# Patient Record
Sex: Female | Born: 1990 | Race: White | Hispanic: No | Marital: Single | Smoking: Current every day smoker
Health system: Southern US, Community
[De-identification: ages and names within clinical notes are randomized; demographics above are authoritative.]

## PROBLEM LIST (undated history)

## (undated) DIAGNOSIS — F319 Bipolar disorder, unspecified: Secondary | ICD-10-CM

## (undated) DIAGNOSIS — F419 Anxiety disorder, unspecified: Secondary | ICD-10-CM

## (undated) DIAGNOSIS — F209 Schizophrenia, unspecified: Secondary | ICD-10-CM

## (undated) DIAGNOSIS — R569 Unspecified convulsions: Secondary | ICD-10-CM

## (undated) HISTORY — DX: Unspecified convulsions: R56.9

## (undated) HISTORY — DX: Anxiety disorder, unspecified: F41.9

## (undated) HISTORY — DX: Schizophrenia, unspecified: F20.9

---
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
## 2012-07-17 ENCOUNTER — Inpatient Hospital Stay (HOSPITAL_COMMUNITY)
Admission: AD | Admit: 2012-07-17 | Discharge: 2012-07-17 | Disposition: A | Discharge status: Home / Self Care | Payer: BC Managed Care – PPO | Source: Ambulatory Visit | Attending: Obstetrics & Gynecology | Admitting: Obstetrics & Gynecology

## 2012-07-17 DIAGNOSIS — N926 Irregular menstruation, unspecified: Secondary | ICD-10-CM

## 2012-07-17 DIAGNOSIS — N938 Other specified abnormal uterine and vaginal bleeding: Secondary | ICD-10-CM | POA: Insufficient documentation

## 2012-07-17 DIAGNOSIS — N949 Unspecified condition associated with female genital organs and menstrual cycle: Secondary | ICD-10-CM | POA: Insufficient documentation

## 2012-07-17 LAB — URINALYSIS, ROUTINE W REFLEX MICROSCOPIC
Leukocytes, UA: NEGATIVE
Nitrite: NEGATIVE
Protein, ur: NEGATIVE mg/dL
Specific Gravity, Urine: 1.01 (ref 1.005–1.030)
Urobilinogen, UA: 0.2 mg/dL (ref 0.0–1.0)

## 2012-07-17 LAB — CBC
MCHC: 33.8 g/dL (ref 30.0–36.0)
Platelets: 190 10*3/uL (ref 150–400)
RDW: 13 % (ref 11.5–15.5)
WBC: 9.4 10*3/uL (ref 4.0–10.5)

## 2012-07-17 LAB — POCT PREGNANCY, URINE: Preg Test, Ur: NEGATIVE

## 2012-07-17 LAB — URINE MICROSCOPIC-ADD ON

## 2012-07-17 LAB — WET PREP, GENITAL

## 2012-07-17 MED ORDER — MEDROXYPROGESTERONE ACETATE 150 MG/ML IM SUSP
150.0000 mg | Freq: Once | INTRAMUSCULAR | Status: AC
  Administered 2012-07-17: 150 mg via INTRAMUSCULAR
  Filled 2012-07-17: qty 1

## 2012-07-17 NOTE — MAU Note (Signed)
Patient is in with c/o irregular heavy vaginal bleeding. She states that she is due for depo provera injection tomorrow. Usually gets it at [facility] physicians. She states that she have been on depo for years never had a problem with bleeding until April 26th. She states that the blood is dark, changing super tampons every 2 hours. She denies pain at this time.

## 2012-07-17 NOTE — MAU Provider Note (Signed)
Attestation of Attending Supervision of Advanced Practitioner (CNM/NP): Evaluation and management procedures were performed by the Advanced Practitioner under my supervision and collaboration.  I have reviewed the Advanced Practitioner's note and chart, and I agree with the management and plan.  HARRAWAY-SMITH, Reena Borromeo 3:04 PM

## 2012-07-17 NOTE — MAU Provider Note (Signed)
History     CSN: [id number]  Arrival date and time: 07/17/12 1308   First Provider Initiated Contact with Patient 07/17/12 1356      Chief Complaint  Patient presents with  . Vaginal Bleeding   HPI Ms. Catherine Shannon is a 22 y.o. female who presents to MAU today for irregular bleeding. The patient states that she has been on Depo provera for almost 5 years. She has not had any issues with irregular bleeding before. She is due for her next injection tomorrow. She started bleeding on 06/09/12 and has had some bleeding most days since then. She states her bleeding has been heavier the last 2-3 days. She changes her tampon every 2-3 hours, but they are not saturated at that time. She went to PCP who gives her Depo and they couldn't "figure out why she was bleeding." They told her to make an appointment with GYN. She has an appointment with Dr. Arelia Sneddon in a few weeks, but came in today because she was concerned about increased bleeding. She denies pain, discharge or fever. She is not currently sexually active because of the bleeding.   OB History   Grav Para Term Preterm Abortions TAB SAB Ect Mult Living                  No past medical history on file.  No past surgical history on file.  No family history on file.  History  Substance Use Topics  . Smoking status: Not on file  . Smokeless tobacco: Not on file  . Alcohol Use: Not on file    Allergies: No Known Allergies  Prescriptions prior to admission  Medication Sig Dispense Refill  . acetaminophen (TYLENOL) 500 MG tablet Take 1,000 mg by mouth every 6 (six) hours as needed for pain.      Marland Kitchen ALPRAZolam (XANAX) 1 MG tablet Take 1 mg by mouth 2 (two) times daily.      Marland Kitchen Fexofenadine HCl (ALLEGRA PO) Take 1 tablet by mouth daily.      Marland Kitchen lamoTRIgine (LAMICTAL) 100 MG tablet Take 100 mg by mouth at bedtime.        Review of Systems  Constitutional: Negative for fever and malaise/fatigue.  Gastrointestinal: Negative for nausea,  vomiting and abdominal pain.  Genitourinary:       + vaginal bleeding Neg - vaginal discharge  Neurological: Negative for dizziness, loss of consciousness and weakness.   Physical Exam   Blood pressure 99/59, pulse 122, temperature 98.6 F (37 C), temperature source Oral, resp. rate 18, height 5\' 6"  (1.676 m), weight 120 lb 2 oz (54.488 kg), SpO2 100.00%.  Physical Exam  Constitutional: She is oriented to person, place, and time. She appears well-developed and well-nourished. No distress.  HENT:  Head: Normocephalic and atraumatic.  Cardiovascular: Normal rate, regular rhythm and normal heart sounds.   Respiratory: Effort normal and breath sounds normal. No respiratory distress.  GI: Soft. Bowel sounds are normal. She exhibits no distension and no mass. There is no tenderness. There is no rebound and no guarding.  Genitourinary: Uterus is not enlarged and not tender. Cervix exhibits no motion tenderness, no discharge and no friability. Right adnexum displays no mass and no tenderness. Left adnexum displays no mass and no tenderness. There is bleeding (scant dark Perryman bleeding noted) around the vagina. No vaginal discharge found.  Neurological: She is alert and oriented to person, place, and time.  Skin: Skin is warm and dry. No erythema.  Psychiatric:  She has a normal mood and affect.   Results for orders placed during the hospital encounter of 07/17/12 (from the past 24 hour(s))  URINALYSIS, ROUTINE W REFLEX MICROSCOPIC     Status: Abnormal   Collection Time    07/17/12  1:15 PM      Result Value Range   Color, Urine YELLOW  YELLOW   APPearance CLEAR  CLEAR   Specific Gravity, Urine 1.010  1.005 - 1.030   pH 7.0  5.0 - 8.0   Glucose, UA NEGATIVE  NEGATIVE mg/dL   Hgb urine dipstick SMALL (*) NEGATIVE   Bilirubin Urine NEGATIVE  NEGATIVE   Ketones, ur NEGATIVE  NEGATIVE mg/dL   Protein, ur NEGATIVE  NEGATIVE mg/dL   Urobilinogen, UA 0.2  0.0 - 1.0 mg/dL   Nitrite NEGATIVE   NEGATIVE   Leukocytes, UA NEGATIVE  NEGATIVE  URINE MICROSCOPIC-ADD ON     Status: None   Collection Time    07/17/12  1:15 PM      Result Value Range   Squamous Epithelial / LPF RARE  RARE   WBC, UA 0-2  <3 WBC/hpf   RBC / HPF 0-2  <3 RBC/hpf   Bacteria, UA RARE  RARE  CBC     Status: None   Collection Time    07/17/12  1:33 PM      Result Value Range   WBC 9.4  4.0 - 10.5 K/uL   RBC 4.46  3.87 - 5.11 MIL/uL   Hemoglobin 13.6  12.0 - 15.0 g/dL   HCT 16.1  09.6 - 04.5 %   MCV 90.1  78.0 - 100.0 fL   MCH 30.5  26.0 - 34.0 pg   MCHC 33.8  30.0 - 36.0 g/dL   RDW 40.9  81.1 - 91.4 %   Platelets 190  150 - 400 K/uL  POCT PREGNANCY, URINE     Status: None   Collection Time    07/17/12  1:52 PM      Result Value Range   Preg Test, Ur NEGATIVE  NEGATIVE  WET PREP, GENITAL     Status: Abnormal   Collection Time    07/17/12  2:12 PM      Result Value Range   Yeast Wet Prep HPF POC NONE SEEN  NONE SEEN   Trich, Wet Prep NONE SEEN  NONE SEEN   Clue Cells Wet Prep HPF POC NONE SEEN  NONE SEEN   WBC, Wet Prep HPF POC FEW (*) NONE SEEN    MAU Course  Procedures None  MDM Hemodynamically stable.  Patient given Depo provera in MAU today. UPT negative.  UA and wet prep negative Assessment and Plan  A: Irregular bleeding on Depo provera  P: Discharge home Patient encouraged to keep follow-up with Dr. Arelia Sneddon as scheduled to establish care with a GYN and discuss other birth control options at that time Patient may return to MAU as needed  Freddi Starr, PA-C  07/17/2012, 2:58 PM

## 2012-10-11 ENCOUNTER — Ambulatory Visit: Payer: BC Managed Care – PPO | Admitting: Family Medicine

## 2012-10-11 VITALS — BP 112/76 | HR 70 | Temp 99.1°F | Resp 16 | Ht 66.5 in | Wt 119.0 lb

## 2012-10-11 DIAGNOSIS — N939 Abnormal uterine and vaginal bleeding, unspecified: Secondary | ICD-10-CM

## 2012-10-11 DIAGNOSIS — N760 Acute vaginitis: Secondary | ICD-10-CM

## 2012-10-11 DIAGNOSIS — F319 Bipolar disorder, unspecified: Secondary | ICD-10-CM

## 2012-10-11 DIAGNOSIS — N898 Other specified noninflammatory disorders of vagina: Secondary | ICD-10-CM

## 2012-10-11 DIAGNOSIS — Z3009 Encounter for other general counseling and advice on contraception: Secondary | ICD-10-CM

## 2012-10-11 DIAGNOSIS — B9689 Other specified bacterial agents as the cause of diseases classified elsewhere: Secondary | ICD-10-CM

## 2012-10-11 LAB — POCT URINALYSIS DIPSTICK
Protein, UA: NEGATIVE
Spec Grav, UA: >=1.03
Urobilinogen, UA: 0.2
pH, UA: 5.5

## 2012-10-11 LAB — POCT UA - MICROSCOPIC ONLY
Bacteria, U Microscopic: NEGATIVE
Casts, Ur, LPF, POC: NEGATIVE
Crystals, Ur, HPF, POC: NEGATIVE
Epithelial cells, urine per micros: NEGATIVE
Mucus, UA: NEGATIVE

## 2012-10-11 LAB — POCT WET PREP WITH KOH: KOH Prep POC: NEGATIVE

## 2012-10-11 MED ORDER — LAMOTRIGINE 25 & 50 & 100 MG PO KIT: PACK | ORAL | Status: DC

## 2012-10-11 MED ORDER — NORGESTIM-ETH ESTRAD TRIPHASIC 0.18/0.215/0.25 MG-25 MCG PO TABS: 1.0000 | ORAL_TABLET | Freq: Every day | ORAL | Status: DC

## 2012-10-11 MED ORDER — ALPRAZOLAM 1 MG PO TABS: 1.0000 mg | ORAL_TABLET | Freq: Two times a day (BID) | ORAL | Status: DC | PRN

## 2012-10-11 MED ORDER — METRONIDAZOLE 500 MG PO TABS: 500.0000 mg | ORAL_TABLET | Freq: Two times a day (BID) | ORAL | Status: DC

## 2012-10-11 NOTE — Progress Notes (Signed)
Subjective:    Patient ID: Catherine Shannon, female    DOB: Sep 24, 1990, 22 y.o.   MRN: [id number] Chief Complaint  Patient presents with  . Vaginal Bleeding    Bleeding sice april on depo  . Medication Refill    zanax, lamictal    HPI  Catherine Shannon is here today with her grandomother Catherine Shannon.  On Depo for about 5-6 years with no bleeding at all and then for the past 4 months, she has had irregular period.  She was seen at [facility] and given her an injection of DepoProvera early which worked for a couple of weeks.  Last Depo shot was in June when she was at the [facility] ER.  Normally she is seen at [facility] with Elvera Lennox and Laurann Montana.  Is not going to back to [place] so wants to transfer care here.  Was last sexually active last night. Does use a condom every single time - in a steady relationship x 5 months.  Wants to do the pill a little. But wants to start on an IUD so will refer to gyn. Has bad anxiety and extremely bipolar - has known it forever.  Has been on lamotrigine for a yr and xanax since Dec.  History reviewed. No pertinent past medical history. Current Outpatient Prescriptions on File Prior to Visit  Medication Sig Dispense Refill  . Fexofenadine HCl (ALLEGRA PO) Take 1 tablet by mouth daily.      Marland Kitchen acetaminophen (TYLENOL) 500 MG tablet Take 1,000 mg by mouth every 6 (six) hours as needed for pain.      Marland Kitchen ALPRAZolam (XANAX) 1 MG tablet Take 1 mg by mouth 2 (two) times daily.      Marland Kitchen lamoTRIgine (LAMICTAL) 100 MG tablet Take 100 mg by mouth at bedtime.       No current facility-administered medications on file prior to visit.   No Known Allergies   Review of Systems  Constitutional: Negative for fever, chills, activity change and appetite change.  Genitourinary: Positive for vaginal bleeding, vaginal discharge and pelvic pain. Negative for dysuria, difficulty urinating and dyspareunia.  Skin: Negative for rash.  Psychiatric/Behavioral: Positive for behavioral problems,  sleep disturbance and dysphoric mood. Negative for suicidal ideas. The patient is nervous/anxious.       BP 112/76  Pulse 70  Temp(Src) 99.1 F (37.3 C) (Oral)  Resp 16  Ht 5' 6.5" (1.689 m)  Wt 119 lb (53.978 kg)  BMI 18.92 kg/m2  SpO2 98% Objective:   Physical Exam  Constitutional: She is oriented to person, place, and time. She appears well-developed and well-nourished. No distress.  HENT:  Head: Normocephalic and atraumatic.  Cardiovascular: Normal rate, regular rhythm, normal heart sounds and intact distal pulses.   Pulmonary/Chest: Effort normal and breath sounds normal.  Abdominal: Soft. Bowel sounds are normal. She exhibits no distension. There is no tenderness. There is no rebound and no guarding.  Genitourinary: Uterus normal. Pelvic exam was performed with patient supine. There is no rash, tenderness or lesion on the right labia. There is no rash, tenderness or lesion on the left labia. Cervix exhibits no motion tenderness and no friability. Right adnexum displays no mass, no tenderness and no fullness. Left adnexum displays no mass, no tenderness and no fullness. No erythema or tenderness around the vagina. Vaginal discharge found.  Lymphadenopathy:       Right: No inguinal adenopathy present.       Left: No inguinal adenopathy present.  Neurological: She is  alert and oriented to person, place, and time.  Skin: Skin is warm and dry. She is not diaphoretic.  Psychiatric: She has a normal mood and affect. Her behavior is normal.           Results for orders placed in visit on 10/11/12  POCT URINE PREGNANCY      Result Value Range   Preg Test, Ur Negative    POCT UA - MICROSCOPIC ONLY      Result Value Range   WBC, Ur, HPF, POC neg     RBC, urine, microscopic 0-3     Bacteria, U Microscopic neg     Mucus, UA neg     Epithelial cells, urine per micros neg     Crystals, Ur, HPF, POC neg     Casts, Ur, LPF, POC neg     Yeast, UA neg    POCT URINALYSIS DIPSTICK       Result Value Range   Color, UA yellow     Clarity, UA clear     Glucose, UA neg     Bilirubin, UA neg     Ketones, UA neg     Spec Grav, UA >=1.030     Blood, UA Large     pH, UA 5.5     Protein, UA neg     Urobilinogen, UA 0.2     Nitrite, UA neg     Leukocytes, UA Negative    POCT WET PREP WITH KOH      Result Value Range   Trichomonas, UA Negative     Clue Cells Wet Prep HPF POC positive     Epithelial Wet Prep HPF POC 10-15     Yeast Wet Prep HPF POC neg     Bacteria Wet Prep HPF POC large     RBC Wet Prep HPF POC 20+     WBC Wet Prep HPF POC 0-3     KOH Prep POC Negative      Assessment & Plan:  Vaginal bleeding - Plan: POCT urine pregnancy, POCT UA - Microscopic Only, POCT urinalysis dipstick, POCT Wet Prep with KOH, CANCELED: POCT CBC, CANCELED: TSH  Bipolar disorder, unspecified - restart xanax and lamictal. [place] [facility] reviewed and appropriate. Addiction potential of xanax briefly reviewed. Rec establishing w/ a therapist.  Family planning - Plan: Ambulatory referral to Gynecology, CANCELED: Ambulatory referral to Gynecology - consider IUD placement, start OCPs in the meantime since bleeding through Depo.  Bacterial vaginosis  Meds ordered this encounter  Medications  . ALPRAZolam (XANAX) 1 MG tablet    Sig: Take 1 tablet (1 mg total) by mouth 2 (two) times daily as needed for anxiety.    Dispense:  60 tablet    Refill:  0  . Norgestimate-Ethinyl Estradiol Triphasic 0.18/0.215/0.25 MG-25 MCG tab    Sig: Take 1 tablet by mouth daily.    Dispense:  1 Package    Refill:  11  . LamoTRIgine 25 & 50 & 100 MG KIT    Sig: Take 25 mg po qd x 2 wks, then 50 mg po qd x 2 wks, then 100 mg po qd    Dispense:  1 kit    Refill:  0  . metroNIDAZOLE (FLAGYL) 500 MG tablet    Sig: Take 1 tablet (500 mg total) by mouth 2 (two) times daily.    Dispense:  14 tablet    Refill:  0

## 2012-10-11 NOTE — Patient Instructions (Addendum)
[facility]  [address], [place], [place] [address]  [phone]  [facility]  Address: [address], [place], [place] [address]  [phone]  [facility] - Dr. Allena Katz Address: [address] [place], [place] [address]  [phone]  [facility] [address] [place], [place] Phone: [phone]  Make an appointment for our 104 clinic next door to establish care so we can renew your medications and review your records.  Contraception Choices Birth control (contraception) can stop pregnancy from happening. Different types of birth control work in different ways. Some can:  Make the mucus in the cervix thick. This makes it hard for sperm to get into the uterus.  Thin the lining of the uterus. This makes it hard for an egg to attach to the wall of the uterus.  Stop the ovaries from releasing an egg.  Block the sperm from reaching the egg. Certain types of surgery can stop pregnancy from happening. For women, the sugery closes the fallopian tubes (tubal ligation). For men, the surgery stops sperm from releasing during sex (vasectomy). HORMONAL BIRTH CONTROL Hormonal birth control stops pregnancy by putting hormones into your body. Types of birth control include:  A small tube put under the skin of the upper arm (implant). The tube can stay in place for 3 years.  Shots given every 3 months.  Pills taken every day or once after sex (intercourse).  Patches that are changed once a week.  A ring put into the vagina (vaginal ring). The ring is left in place for 3 weeks and removed for 1 week. Then, a new ring is put in the vagina. BARRIER BIRTH CONTROL  Barrier birth control blocks sperm from reaching the egg. Types of birth control include:   A thin covering worn on the penis (female condom) during sex.  A soft, loose covering put into the vagina  (female condom) before sex.  A rubber bowl that sits over the cervix (diaphragm). The bowl must be made for you. The bowl is put into the vagina before sex. The bowl is left in place for 6 to 8 hours after sex.  A small, soft cup that fits over the cervix (cervical cap). The cup must be made for you. The cup can be left in place for 48 hours after sex.  A sponge that is put into the vagina before sex.  A chemical that kills or blocks sperm from getting into the cervix and uterus (spermicide). The chemical may be a cream, jelly, foam, or pill. INTRAUTERINE (IUD) BIRTH CONTROL  IUD birth control is a small, T-shaped piece of plastic. The plastic is put inside the uterus. There are 2 types of IUD:  Copper IUD. The IUD is covered in copper wire. The copper makes a fluid that kills sperm. It can stay in place for 10 years.  Hormone IUD. The hormone stops pregnancy from happening. It can stay in place for 5 years. NATURAL FAMILY PLANNING BIRTH CONTROL  Natural family planning means not having sex or using barrier birth control when the woman is fertile. A woman can:  Use a calendar to keep track of when she is fertile.  Use a thermometer to measure her body temperature. Protect yourself against sexual diseases no matter what type of birth control you use. Talk to your doctor about which type of birth control is best for you. Document Released: 11/28/2008 Document Revised: 04/25/2011 Document Reviewed: 06/09/2010  [employer] Patient Information 2014 [place], [place]. Bacterial Vaginosis Bacterial vaginosis (BV) is a vaginal infection where the normal balance of bacteria in the vagina is disrupted. The normal balance is then replaced by an overgrowth of certain bacteria. There are several different kinds of bacteria that can cause BV. BV is the most common vaginal infection in women of childbearing age. CAUSES   The cause of BV is not fully understood. BV develops when there is an increase or  imbalance of harmful bacteria.  Some activities or behaviors can upset the normal balance of bacteria in the vagina and put women at increased risk including:  Having a new sex partner or multiple sex partners.  Douching.  Using an intrauterine device (IUD) for contraception.  It is not clear what role sexual activity plays in the development of BV. However, women that have never had sexual intercourse are rarely infected with BV. Women do not get BV from toilet seats, bedding, swimming pools or from touching objects around them.  SYMPTOMS   Grey vaginal discharge.  A fish-like odor with discharge, especially after sexual intercourse.  Itching or burning of the vagina and vulva.  Burning or pain with urination.  Some women have no signs or symptoms at all. DIAGNOSIS  Your caregiver must examine the vagina for signs of BV. Your caregiver will perform lab tests and look at the sample of vaginal fluid through a microscope. They will look for bacteria and abnormal cells (clue cells), a pH test higher than 4.5, and a positive amine test all associated with BV.  RISKS AND COMPLICATIONS   Pelvic inflammatory disease (PID).  Infections following gynecology surgery.  Developing HIV.  Developing herpes virus. TREATMENT  Sometimes BV will clear up without treatment. However, all women with symptoms of BV should be treated to avoid complications, especially if gynecology surgery is planned. Female partners generally do not need to be treated. However, BV may spread between female sex partners so treatment is helpful in preventing a recurrence of BV.   BV may be treated with antibiotics. The antibiotics come in either pill or vaginal cream forms. Either can be used with nonpregnant or pregnant women, but the recommended dosages differ. These antibiotics are not harmful to the baby.  BV can recur after treatment. If this happens, a second round of antibiotics will often be  prescribed.  Treatment is important for pregnant women. If not treated, BV can cause a premature delivery, especially for a pregnant woman who had a premature birth in the past. All pregnant women who have symptoms of BV should be checked and treated.  For chronic reoccurrence of BV, treatment with a type of prescribed gel vaginally twice a week is helpful. HOME CARE INSTRUCTIONS   Finish all medication as directed by your caregiver.  Do not have sex until treatment is completed.  Tell your sexual partner that you have a vaginal infection. They should see their caregiver and be treated if they have problems, such as a mild rash or itching.  Practice safe sex. Use condoms. Only have 1 sex partner. PREVENTION  Basic prevention steps can help reduce the risk of upsetting the natural balance of bacteria in the vagina and developing BV:  Do not have sexual intercourse (be abstinent).  Do not douche.  Use all of the medicine prescribed for treatment of BV, even if the signs and symptoms go away.  Tell your sex partner if you have BV. That way, they can be treated, if needed, to  prevent reoccurrence. SEEK MEDICAL CARE IF:   Your symptoms are not improving after 3 days of treatment.  You have increased discharge, pain, or fever. MAKE SURE YOU:   Understand these instructions.  Will watch your condition.  Will get help right away if you are not doing well or get worse. FOR MORE INFORMATION  Division of STD Prevention (DSTDP), Centers for Disease Control and Prevention: SolutionApps.co.za American Social Health Association (ASHA): www.ashastd.org  Document Released: 01/31/2005 Document Revised: 04/25/2011 Document Reviewed: 07/24/2008 [facility] Patient Information 2014 [place], [place].

## 2012-10-16 ENCOUNTER — Telehealth: Payer: Self-pay

## 2012-10-16 NOTE — Telephone Encounter (Signed)
Tried to call patient, phone number has been disconnected.  Per Catherine Shannon, patient states her BCP are to expensive.  Patient needs to call her insurance and see what they cover per Rhoderick Moody, PA-C

## 2012-10-24 ENCOUNTER — Telehealth: Payer: Self-pay

## 2012-10-24 NOTE — Telephone Encounter (Signed)
Patient has given [place] a new phone number to call, she is saying that her birth control is too expensive please call her at [phone]

## 2012-10-24 NOTE — Telephone Encounter (Signed)
Left message for her to advise Rx has been sent previously

## 2012-10-24 NOTE — Telephone Encounter (Signed)
She states previously was $13, now it is $115. She is advised to call me back and let me know what is preferred drug with her plan.

## 2012-10-24 NOTE — Telephone Encounter (Signed)
Catherine Shannon was prescribed a month worth of lamictal (in a started kit so she can wean up) and plenty of birth control at her visit on 8/28. She has a f/u visit on 926 scheduled so we can continue her lamictal if it is working for her.  Did the pharmacy not receive the rxs?

## 2012-10-24 NOTE — Telephone Encounter (Signed)
Pt needs refill on Lamictal and BCP. Her insurance took care of her BCP being too high. She just needs it called in now.

## 2012-11-09 ENCOUNTER — Encounter: Payer: BC Managed Care – PPO | Admitting: Family Medicine

## 2012-11-16 ENCOUNTER — Ambulatory Visit (INDEPENDENT_AMBULATORY_CARE_PROVIDER_SITE_OTHER): Payer: BC Managed Care – PPO | Admitting: Women's Health

## 2012-11-16 ENCOUNTER — Encounter: Payer: Self-pay | Admitting: Family Medicine

## 2012-11-16 ENCOUNTER — Ambulatory Visit: Payer: BC Managed Care – PPO | Admitting: Family Medicine

## 2012-11-16 ENCOUNTER — Encounter: Payer: Self-pay | Admitting: Women's Health

## 2012-11-16 VITALS — BP 114/72 | HR 91 | Temp 99.6°F | Resp 16 | Ht 66.5 in | Wt 119.8 lb

## 2012-11-16 VITALS — BP 100/58 | Ht 67.2 in | Wt 120.6 lb

## 2012-11-16 DIAGNOSIS — N949 Unspecified condition associated with female genital organs and menstrual cycle: Secondary | ICD-10-CM

## 2012-11-16 DIAGNOSIS — Z309 Encounter for contraceptive management, unspecified: Secondary | ICD-10-CM

## 2012-11-16 DIAGNOSIS — Z01419 Encounter for gynecological examination (general) (routine) without abnormal findings: Secondary | ICD-10-CM

## 2012-11-16 DIAGNOSIS — B373 Candidiasis of vulva and vagina: Secondary | ICD-10-CM

## 2012-11-16 DIAGNOSIS — N938 Other specified abnormal uterine and vaginal bleeding: Secondary | ICD-10-CM

## 2012-11-16 DIAGNOSIS — IMO0001 Reserved for inherently not codable concepts without codable children: Secondary | ICD-10-CM

## 2012-11-16 DIAGNOSIS — Z23 Encounter for immunization: Secondary | ICD-10-CM

## 2012-11-16 DIAGNOSIS — F39 Unspecified mood [affective] disorder: Secondary | ICD-10-CM

## 2012-11-16 DIAGNOSIS — F319 Bipolar disorder, unspecified: Secondary | ICD-10-CM

## 2012-11-16 LAB — CBC WITH DIFFERENTIAL/PLATELET
Basophils Absolute: 0.1 10*3/uL (ref 0.0–0.1)
Eosinophils Relative: 1 % (ref 0–5)
HCT: 38.4 % (ref 36.0–46.0)
Hemoglobin: 13.4 g/dL (ref 12.0–15.0)
Lymphocytes Relative: 25 % (ref 12–46)
MCHC: 34.9 g/dL (ref 30.0–36.0)
MCV: 89.9 fL (ref 78.0–100.0)
Monocytes Absolute: 0.7 10*3/uL (ref 0.1–1.0)
Monocytes Relative: 9 % (ref 3–12)
RDW: 12.7 % (ref 11.5–15.5)

## 2012-11-16 LAB — WET PREP FOR TRICH, YEAST, CLUE

## 2012-11-16 MED ORDER — FLUCONAZOLE 150 MG PO TABS: 150.0000 mg | ORAL_TABLET | Freq: Once | ORAL | Status: DC

## 2012-11-16 MED ORDER — ALPRAZOLAM 1 MG PO TABS: 1.0000 mg | ORAL_TABLET | Freq: Two times a day (BID) | ORAL | Status: DC | PRN

## 2012-11-16 MED ORDER — NORGESTIMATE-ETH ESTRADIOL 0.25-35 MG-MCG PO TABS: 1.0000 | ORAL_TABLET | Freq: Every day | ORAL | Status: DC

## 2012-11-16 MED ORDER — DIVALPROEX SODIUM ER 500 MG PO TB24: 1000.0000 mg | ORAL_TABLET | Freq: Every day | ORAL | Status: DC

## 2012-11-16 NOTE — Patient Instructions (Addendum)
Dr Lily Peer will place with cycle Intrauterine Device Information An intrauterine device (IUD) is inserted into your uterus and prevents pregnancy. There are 2 types of IUDs available:  Copper IUD. This type of IUD is wrapped in copper wire and is placed inside the uterus. Copper makes the uterus and fallopian tubes produce a fluid that kills sperm. The copper IUD can stay in place for 10 years.  Hormone IUD. This type of IUD contains the hormone progestin (synthetic progesterone). The hormone thickens the cervical mucus and prevents sperm from entering the uterus, and it also thins the uterine lining to prevent implantation of a fertilized egg. The hormone can weaken or kill the sperm that get into the uterus. The hormone IUD can stay in place for 5 years. Your caregiver will make sure you are a good candidate for a contraceptive IUD. Discuss with your caregiver the possible side effects. ADVANTAGES  It is highly effective, reversible, long-acting, and low maintenance.  There are no estrogen-related side effects.  An IUD can be used when breastfeeding.  It is not associated with weight gain.  It works immediately after insertion.  The copper IUD does not interfere with your female hormones.  The progesterone IUD can make heavy menstrual periods lighter.  The progesterone IUD can be used for 5 years.  The copper IUD can be used for 10 years. DISADVANTAGES  The progesterone IUD can be associated with irregular bleeding patterns.  The copper IUD can make your menstrual flow heavier and more painful.  You may experience cramping and vaginal bleeding after insertion. Document Released: 01/05/2004 Document Revised: 04/25/2011 Document Reviewed: 06/05/2010 [facility] Patient Information 2014 [place], [place].

## 2012-11-16 NOTE — Progress Notes (Signed)
Subjective:    Patient ID: Catherine Shannon, female    DOB: 1990/05/05, 22 y.o.   MRN: [id number] Chief Complaint  Patient presents with  . Medication Refill    needs to discuss lamotrigine    HPI  Pt has had a rough month since we last met.  Her mother, with whom she has not had a particularly good relationship was just diagnosed w/ a terminal illness (poss brain cancer?).  She was planning on visiting pt over Nayra's birthday later this mo but now will not be able to so pt is considering going there. She also found out her boyfriend had been cheating on her - she was very dependent and devoted to him so this has been a shock.  Her grandmother is in and out of her life - current out as she is at home. Pt is living with her father who is very supportive. Her favorite activity is riding on his motorcycle with him - might get to go [place] to the mountains this wkend to see her uncle who is not doing well.  Never started the lamictal - it was $300 - called insurance co but got frustrated to never got anything figured out. Using prn xanax - has to take it every night to sleep and has to take it before work as well. Works at [employer]. Definitely wants to be on some sort of mood stabilizer - feels like her bipolar is out of control - just to much irritability - can't control her emotions - which is most troubling while at work.  Has never been on any other bipolar med other than lamictal which did work very well for her but open to whatever will help.  Has not been able to contact or establish with a therapist.  Has an appt with gyn this afternoon to discuss birth control - needs something long-term like IUD or nexplanon to use many of the medications she needs safely.  Past Medical History  Diagnosis Date  . Anxiety   . Depression   . Seizure   . Schizophrenia   . Bipolar 1 disorder    Current Outpatient Prescriptions on File Prior to Visit  Medication Sig Dispense Refill  . acetaminophen  (TYLENOL) 500 MG tablet Take 1,000 mg by mouth every 6 (six) hours as needed for pain.      Marland Kitchen Fexofenadine HCl (ALLEGRA PO) Take 1 tablet by mouth daily.      . metroNIDAZOLE (FLAGYL) 500 MG tablet Take 1 tablet (500 mg total) by mouth 2 (two) times daily.  14 tablet  0   No current facility-administered medications on file prior to visit.   No Known Allergies   Review of Systems  Constitutional: Positive for fatigue. Negative for fever, chills, diaphoresis, activity change, appetite change and unexpected weight change.  Cardiovascular: Negative for chest pain.  Gastrointestinal: Negative for nausea and vomiting.  Psychiatric/Behavioral: Positive for behavioral problems, sleep disturbance, dysphoric mood, decreased concentration and agitation. Negative for hallucinations and confusion. The patient is nervous/anxious. The patient is not hyperactive.       BP 114/72  Pulse 91  Temp(Src) 99.6 F (37.6 C) (Oral)  Resp 16  Ht 5' 6.5" (1.689 m)  Wt 119 lb 12.8 oz (54.341 kg)  BMI 19.05 kg/m2  SpO2 98%  LMP 11/12/2012 Objective:   Physical Exam  Constitutional: She is oriented to person, place, and time. She appears well-developed and well-nourished. No distress.  HENT:  Head: Normocephalic and atraumatic.  Right Ear: External ear normal.  Left Ear: External ear normal.  Eyes: Conjunctivae are normal. No scleral icterus.  Neck: Normal range of motion. Neck supple. No thyromegaly present.  Cardiovascular: Normal rate, regular rhythm, normal heart sounds and intact distal pulses.   Pulmonary/Chest: Effort normal and breath sounds normal. No respiratory distress.  Musculoskeletal: She exhibits no edema.  Lymphadenopathy:    She has no cervical adenopathy.  Neurological: She is alert and oriented to person, place, and time.  Skin: Skin is warm and dry. She is not diaphoretic. No erythema.  Psychiatric: She has a normal mood and affect. Her behavior is normal.          Assessment  & Plan:  Bipolar disorder, unspecified - Plan: Ambulatory referral to Psychiatry Will try alternative mood stablizier - pt seems to be in a more depressive phase of her bipolar so will to trial of Depakote though per Epocrates therapeutic doses are quite high so may need to titrate up quickly from below. Alternatively, could try or add in atypical antidepressant at night light zyprexa/geodon/seroquel - esp as pt is complaining of not being able to sleep or eat.  Discussed possible need to do trial of lithium if her insurance won't cover depakote though I would certainly feel more comfortable if she could establish with a psychiatrist to help [place] with medications.  Pt open to whatever will help. Meds ordered this encounter  Medications  . ALPRAZolam (XANAX) 1 MG tablet    Sig: Take 1 tablet (1 mg total) by mouth 2 (two) times daily as needed for anxiety.    Dispense:  60 tablet    Refill:  1  . divalproex (DEPAKOTE ER) 500 MG 24 hr tablet    Sig: Take 2 tablets (1,000 mg total) by mouth daily.    Dispense:  60 tablet    Refill:  0

## 2012-11-16 NOTE — Patient Instructions (Addendum)
If these medications are to expensive, call me so we can try something else. Lets get you to a psychiatrist so we can get you better treatment. Call and schedule the therapy appt through your job TODAY!!!!  Do it NOW!  Here are some other excellent providers in town who might be able to better adjust your medications than I can though I am always happy to see you. Make sure that you are tested for all STDs today at your gynecology visit. I highly recommend that you get an IUD as many of the bipolar medications that we may need to try could be VERY dangerous if you accidentally became pregnant.  [facility]  [address], [place], [place] [address]  [phone]  [facility]  Address: [address], [place], [place] [address]  [phone]  [facility] - Dr. Allena Katz Address: [address] [place], [place] [address]  [phone]  [facility] [address] [place], [id number] Phone: [phone]

## 2012-11-16 NOTE — Progress Notes (Signed)
Catherine Shannon 27-Jul-1990 [id number]    History:    New patient presents for annual exam and problem of irregular cycles. Last Depo-Provera injection July 2014 and started on Ortho Tri-Cyclen Lo September. Has had some irregular bleeding prior to her last depo and while on pills. Negative STD screening June 2014/ same partner. Had one gardasil injection 2010. Does see a counselor and psychiatrist to manage bipolar condition. Smoker  Past medical history, past surgical history, family history and social history were all reviewed and documented in the EPIC chart. Student at [employer], Counsellor, works at [employer]. Minimal contact with her mother, has always lived with her father.   ROS:  A  ROS was performed and pertinent positives and negatives are included in the history.  Exam:  Filed Vitals:   11/16/12 1451  BP: 100/58    General appearance:  Numerous tatoos. Head/Neck:  Normal, without cervical or supraclavicular adenopathy. Thyroid:  Symmetrical, normal in size, without palpable masses or nodularity. Respiratory  Effort:  Normal  Auscultation:  Clear without wheezing or rhonchi Cardiovascular  Auscultation:  Regular rate, without rubs, murmurs or gallops  Edema/varicosities:  Not grossly evident Abdominal  Soft,nontender, without masses, guarding or rebound.  Liver/spleen:  No organomegaly noted  Hernia:  None appreciated  Skin  Inspection:  Grossly normal  Palpation:  Grossly normal Neurologic/psychiatric  Orientation:  Normal with appropriate conversation.  Mood/affect:  Normal  Genitourinary    Breasts: Examined lying and sitting. Bilateral  piercing    Right: Without masses, retractions, discharge or axillary adenopathy.     Left: Without masses, retractions, discharge or axillary adenopathy.   Inguinal/mons:  Normal without inguinal adenopathy  External genitalia:  Normal  BUS/Urethra/Skene's glands:  Normal  Bladder:  Normal  Vagina:  Normal  Cervix:   Normal  Uterus:   normal in size, shape and contour.  Midline and mobile  Adnexa/parametria:     Rt: Without masses or tenderness.   Lt: Without masses or tenderness.  Anus and perineum: Normal   Assessment/Plan:  22 y.o. S. WF G0 for annual exam requesting Mirena IUD.  Irregular bleeding on Depo-Provera and on Ortho Tri-Cyclen Lo Bipolar managed by behavioral health Contraception management  Plan: Gardasil information given and reviewed first given return to office in 2 and 6 months to complete series. Options reviewed for contraception. Would like Mirena IUD. Reviewed slight risk for infection, perforation, hemorrhage. Dr. Lily Peer to place with next cycle, will check coverage. CBC, TSH, prolactin, UA. Ortho-Cyclen prescription, proper use given and reviewed slight risk for blood clots and strokes. Start after complaining current cycle.   Harrington Challenger WHNP, 5:30 PM 11/16/2012

## 2012-11-17 LAB — URINALYSIS W MICROSCOPIC + REFLEX CULTURE
Bacteria, UA: NONE SEEN
Bilirubin Urine: NEGATIVE
Casts: NONE SEEN
Crystals: NONE SEEN
Glucose, UA: NEGATIVE mg/dL
Ketones, ur: NEGATIVE mg/dL
Specific Gravity, Urine: 1.017 (ref 1.005–1.030)
Squamous Epithelial / LPF: NONE SEEN
Urobilinogen, UA: 1 mg/dL (ref 0.0–1.0)

## 2012-11-21 DIAGNOSIS — F319 Bipolar disorder, unspecified: Secondary | ICD-10-CM | POA: Insufficient documentation

## 2012-11-26 ENCOUNTER — Telehealth: Payer: Self-pay

## 2012-11-26 NOTE — Telephone Encounter (Signed)
Patient states that her bipolar medicine is too expensive.   Best#: [phone]

## 2012-11-28 NOTE — Telephone Encounter (Signed)
I'm sorry. I really think it would be best for pt to start new medication with a psychiatrist since she may require something cheaper like lithium which I have minimal experience in prescribing. [facility] and her insurance authorized it. She was to call and make own appt at [phone]. Has she done so yet? When is it?  We can try some zyprexa or geodon in the meantime to get her to her appt but I don't think these will help quite as much as the actual bipolar meds.

## 2012-11-29 NOTE — Telephone Encounter (Signed)
Called her, to advise. She does not want to see the psychiatric doctor. She states she just does not want to do this, no explanation given.

## 2012-11-29 NOTE — Telephone Encounter (Signed)
Thank you. I have called her left message for her to call me back.  

## 2012-11-30 MED ORDER — OLANZAPINE 5 MG PO TABS: 5.0000 mg | ORAL_TABLET | Freq: Every day | ORAL | Status: DC

## 2012-11-30 NOTE — Telephone Encounter (Signed)
She will get better care with a specialist but if she would rather, we can go ahead and try zyprexa 5mg  every night - sent to pharmacy. depakote d/c'd since pt couldn't afford.

## 2012-11-30 NOTE — Telephone Encounter (Signed)
Called patient, number disconnected  

## 2012-12-02 NOTE — Telephone Encounter (Signed)
Called pt, phone not in service. Im going to send unable to reach letter

## 2012-12-24 ENCOUNTER — Telehealth: Payer: Self-pay

## 2012-12-24 ENCOUNTER — Ambulatory Visit: Payer: BC Managed Care – PPO | Admitting: Family Medicine

## 2012-12-24 ENCOUNTER — Telehealth: Payer: Self-pay | Admitting: *Deleted

## 2012-12-24 VITALS — BP 106/68 | HR 80 | Temp 98.6°F | Resp 16 | Ht 66.5 in | Wt 120.0 lb

## 2012-12-24 DIAGNOSIS — N921 Excessive and frequent menstruation with irregular cycle: Secondary | ICD-10-CM

## 2012-12-24 DIAGNOSIS — Z309 Encounter for contraceptive management, unspecified: Secondary | ICD-10-CM

## 2012-12-24 DIAGNOSIS — N949 Unspecified condition associated with female genital organs and menstrual cycle: Secondary | ICD-10-CM

## 2012-12-24 DIAGNOSIS — N939 Abnormal uterine and vaginal bleeding, unspecified: Secondary | ICD-10-CM

## 2012-12-24 DIAGNOSIS — N898 Other specified noninflammatory disorders of vagina: Secondary | ICD-10-CM

## 2012-12-24 DIAGNOSIS — Z130 Encounter for screening for diseases of the blood and blood-forming organs and certain disorders involving the immune mechanism: Secondary | ICD-10-CM

## 2012-12-24 DIAGNOSIS — N938 Other specified abnormal uterine and vaginal bleeding: Secondary | ICD-10-CM

## 2012-12-24 LAB — POCT CBC
Granulocyte percent: 68.4 %G (ref 37–80)
HCT, POC: 43.6 % (ref 37.7–47.9)
Hemoglobin: 13.5 g/dL (ref 12.2–16.2)
Lymph, poc: 2.4 (ref 0.6–3.4)
MCH, POC: 29.9 pg (ref 27–31.2)
MCHC: 31 g/dL — AB (ref 31.8–35.4)
MCV: 96.7 fL (ref 80–97)
MID (cbc): 0.6 (ref 0–0.9)
MPV: 9.7 fL (ref 0–99.8)
POC Granulocyte: 6.5 (ref 2–6.9)
POC LYMPH PERCENT: 25.3 % (ref 10–50)
POC MID %: 6.3 %M (ref 0–12)
Platelet Count, POC: 233 10*3/uL (ref 142–424)
RBC: 4.51 M/uL (ref 4.04–5.48)
RDW, POC: 14 %
WBC: 9.5 10*3/uL (ref 4.6–10.2)

## 2012-12-24 LAB — POCT URINE PREGNANCY: Preg Test, Ur: NEGATIVE

## 2012-12-24 NOTE — Telephone Encounter (Signed)
Left message for pt to call, informed wendy to check benefits for mirena IUD

## 2012-12-24 NOTE — Telephone Encounter (Signed)
Pt called and reported that she is having her 2nd period since Halloween. She is currently "bleeding heavier than she ever has". I d/w her that she saw the GYN in Oct and I asked her about the BCP they started her on. She reported that she didn't know which MD had put her on the BCP but she stopped taking it after a week because it didn't stop her bleeding. I advised her that we would be happy to see her for this, but  advised that she should call the GYN and report that the BCP had not been effective for bleeding and she DCd it, and to give them details of her current Sxs. I gave pt the name and phone number of the GYN she saw and she agreed to call them.

## 2012-12-24 NOTE — Telephone Encounter (Signed)
last menstrual period was on 12/11/12 stopped bleeding on Nov. 2 started back bleeding again this am, which is heavy. Pt saw nancy on 11/16/12 (see OV note) , she never took the birth control pills prescribed.  #1. Please make sure patient does a home pregnancy test #2 tissue make arrangements for the Mirena IUD? #3 please check with Toniann Fail making sure that the insurance company covers it and if she wants to have it we can put in and this week to help with her bleeding. #4 tell her that her recent CBC, TSH and prolactin were normal

## 2012-12-24 NOTE — Progress Notes (Signed)
[facility]:  Office Visit  Chief Complaint:  Chief Complaint  Patient presents with  . Menstrual Problem    see past OV's- Pt was taking DepoProvera for 6 years- had last Depo in 08/2012 in ER  . LMP    10/129/14 - began bleeding again yesterday    HPI: Catherine Shannon is a 22 y.o. female who is here for abnormal vaginal bleeding She started having irregular bleeds on Depot in April Er last depo injection was in 08/2012, she was given a lo seasonique OCP and took only 1 week 1/2 of it and she did not take it correctly, she took it and it made her bleed more and heavier and made her abd cramps worse. LMP 12/23/2012, prior to this she ahd her cycle on Dec 12, 2012  She wants mirena, next appt is 12/02/12 with Dr Lily Peer either for consultation or IUD placement Has taken  goody's powder regular.  Past Medical History  Diagnosis Date  . Anxiety   . Depression   . Seizure   . Schizophrenia   . Bipolar 1 disorder    No past surgical history on file. History   Social History  . Marital Status: Single    Spouse Name: N/A    Number of Children: N/A  . Years of Education: N/A   Social History Main Topics  . Smoking status: Current Every Day Smoker -- 1.00 packs/day for 5 years    Types: Cigarettes  . Smokeless tobacco: None  . Alcohol Use: No  . Drug Use: Yes    Special: Marijuana  . Sexual Activity: Yes    Birth Control/ Protection: Pill   Other Topics Concern  . None   Social History Narrative  . None   Family History  Problem Relation Age of Onset  . Seizures Mother   . Depression Mother   . Schizophrenia Mother   . Depression Father    No Known Allergies Prior to Admission medications   Medication Sig Start Date End Date Taking? Authorizing Provider  acetaminophen (TYLENOL) 500 MG tablet Take 1,000 mg by mouth every 6 (six) hours as needed for pain.    Historical Provider, MD  ALPRAZolam Prudy Feeler) 1 MG tablet Take 1 tablet (1 mg total) by  mouth 2 (two) times daily as needed for anxiety. 11/16/12   Sherren Mocha, MD  Fexofenadine HCl (ALLEGRA PO) Take 1 tablet by mouth daily.    Historical Provider, MD  fluconazole (DIFLUCAN) 150 MG tablet Take 1 tablet (150 mg total) by mouth once. 11/16/12   Harrington Challenger, NP  metroNIDAZOLE (FLAGYL) 500 MG tablet Take 1 tablet (500 mg total) by mouth 2 (two) times daily. 10/11/12   Sherren Mocha, MD  norgestimate-ethinyl estradiol (ORTHO-CYCLEN,SPRINTEC,PREVIFEM) 0.25-35 MG-MCG tablet Take 1 tablet by mouth daily. 11/16/12   Harrington Challenger, NP  OLANZapine (ZYPREXA) 5 MG tablet Take 1 tablet (5 mg total) by mouth at bedtime. 11/30/12   Sherren Mocha, MD     ROS: The patient denies fevers, chills, night sweats, unintentional weight loss, chest pain, palpitations, wheezing, dyspnea on exertion, nausea, vomiting, abdominal pain, dysuria, hematuria, melena, numbness, weakness, or tingling.   All other systems have been reviewed and were otherwise negative with the exception of those mentioned in the HPI and as above.    PHYSICAL EXAM: Filed Vitals:   12/24/12 1451  BP: 106/68  Pulse: 80  Temp: 98.6 F (37 C)  Resp: 16  Filed Vitals:   12/24/12 1451  Height: 5' 6.5" (1.689 m)  Weight: 120 lb (54.432 kg)   Body mass index is 19.08 kg/(m^2).  General: Alert, no acute distress HEENT:  Normocephalic, atraumatic, oropharynx patent. EOMI, PERRLA Cardiovascular:  Regular rate and rhythm, no rubs murmurs or gallops.  No Carotid bruits, radial pulse intact. No pedal edema.  Respiratory: Clear to auscultation bilaterally.  No wheezes, rales, or rhonchi.  No cyanosis, no use of accessory musculature GI: No organomegaly, abdomen is soft and non-tender, positive bowel sounds.  No masses. Skin: No rashes. Neurologic: Facial musculature symmetric. Psychiatric: Patient is appropriate throughout our interaction. Lymphatic: No cervical lymphadenopathy Musculoskeletal: Gait intact.   LABS: Results for orders  placed in visit on 12/24/12  POCT CBC      Result Value Range   WBC 9.5  4.6 - 10.2 K/uL   Lymph, poc 2.4  0.6 - 3.4   POC LYMPH PERCENT 25.3  10 - 50 %L   MID (cbc) 0.6  0 - 0.9   POC MID % 6.3  0 - 12 %M   POC Granulocyte 6.5  2 - 6.9   Granulocyte percent 68.4  37 - 80 %G   RBC 4.51  4.04 - 5.48 M/uL   Hemoglobin 13.5  12.2 - 16.2 g/dL   HCT, POC 45.4  09.8 - 47.9 %   MCV 96.7  80 - 97 fL   MCH, POC 29.9  27 - 31.2 pg   MCHC 31.0 (*) 31.8 - 35.4 g/dL   RDW, POC 11.9     Platelet Count, POC 233  142 - 424 K/uL   MPV 9.7  0 - 99.8 fL  POCT URINE PREGNANCY      Result Value Range   Preg Test, Ur Negative       EKG/XRAY:   Primary read interpreted by Dr. Conley Rolls at [facility].   ASSESSMENT/PLAN: Encounter Diagnoses  Name Primary?  . Screening for deficiency anemia Yes  . Contraception management   . Metrorrhagia   . Vaginal bleeding    Patient declined to be on orthocyclen or any other pills forms to help with regulating cycle and dereasing flow, she also doe snot want to be on Depot anymore She would like to get something more long term She does not want to take pills of any sort Advise to stop taking Cristy Hilts I spoke with [facility] and spoke with [place] and she states that they will try to get in a soon as possible. She needs to get insurance for mirena Patient agrees and will call her ob/gyn if she does not hear about appt in next 1-2 days F/u prn   Gross sideeffects, risk and benefits, and alternatives of medications d/w patient. Patient is aware that all medications have potential sideeffects and we are unable to predict every sideeffect or drug-drug interaction that may occur.  Hamilton Capri PHUONG, DO 12/24/2012 5:13 PM

## 2012-12-24 NOTE — Telephone Encounter (Signed)
You are back up MD) pt last menstrual period was on 12/11/12 stopped bleeding on Nov. 2 started back bleeding again this am, which is heavy. Pt saw nancy on 11/16/12 (see OV note) , she never took the birth control pills prescribed. Pt would like recommendations. Please advise

## 2012-12-26 ENCOUNTER — Telehealth: Payer: Self-pay | Admitting: *Deleted

## 2012-12-26 NOTE — Telephone Encounter (Signed)
Message copied by Richardson Chiquito on Wed Dec 26, 2012  9:05 AM ------      Message from: [place], Harriett Sine J      Created: Tue Dec 25, 2012  3:35 PM       She's had a negative STD screen, normal TSH and prolactin. No intercourse until IUD to be placed. When did she  last take birth control pills? Do think she will get regular cycle?       ----- Message -----         From: Janus Molder, CMA         Sent: 12/25/2012   2:27 PM           To: Harrington Challenger, NP            Harriett Sine,             Dr Conley Rolls called yesterday afternoon regarding this patient. Irregular bleeding on depo and as given OCP to help with bleeding but only took for a week and a half and then she discontinued it. The patient wants a Mirena. Toniann Fail has checked benefits. Do you need to see her regarding the bleeding prior to having having IUD Inserted? She had a negative urine pregnancy with last intercourse being 10/24. She is out of Depo range, last depo 08/22/12. I advised no intercourse til we see her.             Pls advise.             Sherrilyn Rist        ------

## 2012-12-26 NOTE — Telephone Encounter (Signed)
LM for pt to call me back regarding her visit per [place]. KW

## 2012-12-28 ENCOUNTER — Other Ambulatory Visit: Payer: Self-pay | Admitting: Women's Health

## 2012-12-28 NOTE — Telephone Encounter (Signed)
Ny talked to patient. Instructed to start new pack of pills Sunday and to follow up with cycle to insert IUD. Use condoms also advised by [facility]. KW CMA/ [place]

## 2013-01-02 ENCOUNTER — Ambulatory Visit (INDEPENDENT_AMBULATORY_CARE_PROVIDER_SITE_OTHER): Payer: BC Managed Care – PPO | Admitting: *Deleted

## 2013-01-02 DIAGNOSIS — Z23 Encounter for immunization: Secondary | ICD-10-CM

## 2013-01-02 NOTE — Telephone Encounter (Signed)
Catherine Shannon spoke with patient on 12/26/12 regarding the below. Pt never called me back.

## 2013-01-25 ENCOUNTER — Encounter: Payer: Self-pay | Admitting: *Deleted

## 2013-01-25 NOTE — Progress Notes (Signed)
Patient ID: Catherine Shannon, female   DOB: August 14, 1990, 22 y.o.   MRN: [id number] Mirena benefits checked 12/25/12  100% no copay  No preexisting No precert needed  Checked by Jene Every CMA

## 2013-01-28 ENCOUNTER — Encounter: Payer: Self-pay | Admitting: Gynecology

## 2013-01-28 ENCOUNTER — Ambulatory Visit (INDEPENDENT_AMBULATORY_CARE_PROVIDER_SITE_OTHER): Payer: BC Managed Care – PPO | Admitting: Gynecology

## 2013-01-28 VITALS — BP 128/76

## 2013-01-28 DIAGNOSIS — Z975 Presence of (intrauterine) contraceptive device: Secondary | ICD-10-CM | POA: Insufficient documentation

## 2013-01-28 DIAGNOSIS — Z30430 Encounter for insertion of intrauterine contraceptive device: Secondary | ICD-10-CM

## 2013-01-28 NOTE — Patient Instructions (Signed)
Levonorgestrel intrauterine device (IUD) What is this medicine? LEVONORGESTREL IUD (LEE voe nor jes trel) is a contraceptive (birth control) device. The device is placed inside the uterus by a healthcare professional. It is used to prevent pregnancy and can also be used to treat heavy bleeding that occurs during your period. Depending on the device, it can be used for 3 to 5 years. This medicine may be used for other purposes; ask your health care provider or pharmacist if you have questions. COMMON BRAND NAME(S): Mirena, Skyla What should I tell my health care provider before I take this medicine? They need to know if you have any of these conditions: -abnormal Pap smear -cancer of the breast, uterus, or cervix -diabetes -endometritis -genital or pelvic infection now or in the past -have more than one sexual partner or your partner has more than one partner -heart disease -history of an ectopic or tubal pregnancy -immune system problems -IUD in place -liver disease or tumor -problems with blood clots or take blood-thinners -use intravenous drugs -uterus of unusual shape -vaginal bleeding that has not been explained -an unusual or allergic reaction to levonorgestrel, other hormones, silicone, or polyethylene, medicines, foods, dyes, or preservatives -pregnant or trying to get pregnant -breast-feeding How should I use this medicine? This device is placed inside the uterus by a health care professional. Talk to your pediatrician regarding the use of this medicine in children. Special care may be needed. Overdosage: If you think you have taken too much of this medicine contact a poison control center or emergency room at once. NOTE: This medicine is only for you. Do not share this medicine with others. What if I miss a dose? This does not apply. What may interact with this medicine? Do not take this medicine with any of the following  medications: -amprenavir -bosentan -fosamprenavir This medicine may also interact with the following medications: -aprepitant -barbiturate medicines for inducing sleep or treating seizures -bexarotene -griseofulvin -medicines to treat seizures like carbamazepine, ethotoin, felbamate, oxcarbazepine, phenytoin, topiramate -modafinil -pioglitazone -rifabutin -rifampin -rifapentine -some medicines to treat HIV infection like atazanavir, indinavir, lopinavir, nelfinavir, tipranavir, ritonavir -St. John's wort -warfarin This list may not describe all possible interactions. Give your health care provider a list of all the medicines, herbs, non-prescription drugs, or dietary supplements you use. Also tell them if you smoke, drink alcohol, or use illegal drugs. Some items may interact with your medicine. What should I watch for while using this medicine? Visit your doctor or health care professional for regular check ups. See your doctor if you or your partner has sexual contact with others, becomes HIV positive, or gets a sexual transmitted disease. This product does not protect you against HIV infection (AIDS) or other sexually transmitted diseases. You can check the placement of the IUD yourself by reaching up to the top of your vagina with clean fingers to feel the threads. Do not pull on the threads. It is a good habit to check placement after each menstrual period. Call your doctor right away if you feel more of the IUD than just the threads or if you cannot feel the threads at all. The IUD may come out by itself. You may become pregnant if the device comes out. If you notice that the IUD has come out use a backup birth control method like condoms and call your health care provider. Using tampons will not change the position of the IUD and are okay to use during your period. What side effects may I   notice from receiving this medicine? Side effects that you should report to your doctor or  health care professional as soon as possible: -allergic reactions like skin rash, itching or hives, swelling of the face, lips, or tongue -fever, flu-like symptoms -genital sores -high blood pressure -no menstrual period for 6 weeks during use -pain, swelling, warmth in the leg -pelvic pain or tenderness -severe or sudden headache -signs of pregnancy -stomach cramping -sudden shortness of breath -trouble with balance, talking, or walking -unusual vaginal bleeding, discharge -yellowing of the eyes or skin Side effects that usually do not require medical attention (report to your doctor or health care professional if they continue or are bothersome): -acne -breast pain -change in sex drive or performance -changes in weight -cramping, dizziness, or faintness while the device is being inserted -headache -irregular menstrual bleeding within first 3 to 6 months of use -nausea This list may not describe all possible side effects. Call your doctor for medical advice about side effects. You may report side effects to FDA at 1-800-FDA-1088. Where should I keep my medicine? This does not apply. NOTE: This sheet is a summary. It may not cover all possible information. If you have questions about this medicine, talk to your doctor, pharmacist, or health care provider.  2014, Elsevier/Gold Standard. (2011-03-03 13:54:04)  

## 2013-01-28 NOTE — Progress Notes (Signed)
The patient is a 22 year old who presented to the office today to have the Mirena IUD placed. She had her annual gynecological examination in October of this year. She had received Depo-Provera injection July 2014 and started on Ortho Tri-Cyclen Lo in September. She had some irregular bleeding back and probably attributed to compliance issues and wanted something more permanent and something that she would not have to remember to take daily. She's currently not sexually active and she is menstruating today. She did receive her HPV vaccine in 2010 and a series second shot was given last November she will return back in 2 months for her next HPV vaccine.                                                                    IUD procedure note       Patient presented to the office today for placement of Mirena IUD. The patient had previously been provided with literature information on this method of contraception. The risks benefits and pros and cons were discussed and all her questions were answered. She is fully aware that this form of contraception is 99% effective and is good for 5 years.  Pelvic exam: Bartholin urethra Skene glands: Within normal limits Vagina: No lesions or discharge, menstrual blood Cervix: No lesions or discharge Uterus: anteverted position Adnexa: No masses or tenderness Rectal exam: Not done  The cervix was cleansed with Betadine solution. A single-tooth tenaculum was placed on the anterior cervical lip. The uterus sounded to 6-1/2 centimeter. The IUD was shown to the patient and inserted in a sterile fashion. The IUD string was trimmed. The single-tooth tenaculum was removed. Patient was instructed to return back to the office in one month for follow up.       Lot #[id number]

## 2013-01-29 ENCOUNTER — Encounter: Payer: Self-pay | Admitting: Gynecology

## 2013-02-20 ENCOUNTER — Telehealth: Payer: Self-pay | Admitting: *Deleted

## 2013-02-20 NOTE — Telephone Encounter (Signed)
Pt called c/o vaginal bleeding that started today, had IUD placed on 01/28/13. I explained to pt that not usual to have bleeding after placement. Pt will follow up if bleeding continues long term or becomes heavy.

## 2013-09-05 ENCOUNTER — Telehealth: Payer: Self-pay

## 2013-09-05 ENCOUNTER — Other Ambulatory Visit: Payer: Self-pay | Admitting: Gynecology

## 2013-09-05 DIAGNOSIS — N939 Abnormal uterine and vaginal bleeding, unspecified: Secondary | ICD-10-CM

## 2013-09-05 NOTE — Telephone Encounter (Signed)
Yes I would please schedule at the same time thank you

## 2013-09-05 NOTE — Telephone Encounter (Signed)
Patient informed. Lupita LeashDonna will call her back to schedule appts.

## 2013-09-05 NOTE — Telephone Encounter (Signed)
Had MIrena inserted back in Dec. She said she has bled for the last 2-3 months off and on from light to heavy and sometimes with really painful cramps.   I was going to recommend office visit but wanted to be sure you did not want to schedule u/s at same visit? Just checking.

## 2013-09-05 NOTE — Telephone Encounter (Signed)
Error encounter already open. °

## 2013-09-23 ENCOUNTER — Ambulatory Visit: Payer: BC Managed Care – PPO | Admitting: Gynecology

## 2013-09-23 ENCOUNTER — Other Ambulatory Visit: Payer: BC Managed Care – PPO

## 2013-09-25 ENCOUNTER — Encounter: Payer: Self-pay | Admitting: Gynecology

## 2013-09-25 ENCOUNTER — Other Ambulatory Visit: Payer: Self-pay | Admitting: Gynecology

## 2013-09-25 ENCOUNTER — Ambulatory Visit (INDEPENDENT_AMBULATORY_CARE_PROVIDER_SITE_OTHER): Payer: BC Managed Care – PPO

## 2013-09-25 ENCOUNTER — Ambulatory Visit (INDEPENDENT_AMBULATORY_CARE_PROVIDER_SITE_OTHER): Payer: BC Managed Care – PPO | Admitting: Gynecology

## 2013-09-25 DIAGNOSIS — N949 Unspecified condition associated with female genital organs and menstrual cycle: Secondary | ICD-10-CM

## 2013-09-25 DIAGNOSIS — Z975 Presence of (intrauterine) contraceptive device: Secondary | ICD-10-CM

## 2013-09-25 DIAGNOSIS — N921 Excessive and frequent menstruation with irregular cycle: Secondary | ICD-10-CM

## 2013-09-25 DIAGNOSIS — T8389XA Other specified complication of genitourinary prosthetic devices, implants and grafts, initial encounter: Secondary | ICD-10-CM

## 2013-09-25 DIAGNOSIS — IMO0002 Reserved for concepts with insufficient information to code with codable children: Secondary | ICD-10-CM

## 2013-09-25 DIAGNOSIS — N831 Corpus luteum cyst of ovary, unspecified side: Secondary | ICD-10-CM

## 2013-09-25 DIAGNOSIS — N938 Other specified abnormal uterine and vaginal bleeding: Secondary | ICD-10-CM

## 2013-09-25 DIAGNOSIS — N939 Abnormal uterine and vaginal bleeding, unspecified: Secondary | ICD-10-CM

## 2013-09-25 DIAGNOSIS — N925 Other specified irregular menstruation: Secondary | ICD-10-CM

## 2013-09-25 MED ORDER — NORETHIN ACE-ETH ESTRAD-FE 1-20 MG-MCG PO TABS: ORAL_TABLET | ORAL | Status: DC

## 2013-09-25 NOTE — Progress Notes (Addendum)
   Patient is a 23 year old who was seen in the office in December of 2014 as a new patient referred for management of her menorrhagia. Patient had a Mirena IUD placed on that month and states that she continues to bleed just about every other day. On further questioning she stated that she has always had issues with heavy bleeding since she started her menarche. She states that occasionally she has easy bruising. No one has ever done any workup. She has been in the past on oral contraceptive pills since she was an adolescent but then switched to Depo-Provera for approximately 5 years discontinued it because of breakthrough bleeding after that started on the oral contraceptive pill again but there is an issue with compliance which we thought was contributing to her irregular bleeding. Patient was asked to come to the office today for an ultrasound to confirm positioning of the IUD. She did not return to the office a month after placing a Mirena IUD as had been instructed.  Ultrasound today: Uterus measures 7.8 x 4.4 x 2.9 cm with endometrial stripe of 4.6 mm. IUD and was seen in the normal position inside the uterine cavity. She had a small right ovarian follicle 12 x 11 mm negative color flow. Left ovary was normal. No apparent adnexal masses. Patient refuses transvaginal ultrasound.  Assessment/plan: Patient with persistent irregular bleeding despite being on Mirena  IUD. We're going to start her on Junel 1/20 oral contraceptive pill to take continuously the next 3 months. We're also going to check a von Willebrand panel and a CBC today. If this continues after the 3 months of the oral contraceptive pill we may need to consider starting her back on the oral contraceptive pill and stress on her the importance of good compliance. Patient also has history of bipolar disorder and has not follow that recommendation of her primary physician to follow up with the psychiatrist. I have given her names and number of  several therapist in the community for her to get reestablished.

## 2014-02-20 ENCOUNTER — Telehealth: Payer: Self-pay | Admitting: *Deleted

## 2014-02-20 NOTE — Telephone Encounter (Signed)
Left message for pt to call per JF to see how patient is doing with bleeding with Mirena IUD.

## 2014-02-20 NOTE — Telephone Encounter (Signed)
Pt said she is doing well and has spotting form time to time, but not bad like before. Pt said she finished her 3 pack of birth control pills

## 2014-03-17 ENCOUNTER — Other Ambulatory Visit (HOSPITAL_COMMUNITY)
Admission: RE | Admit: 2014-03-17 | Discharge: 2014-03-17 | Disposition: A | Discharge status: Home / Self Care | Payer: BLUE CROSS/BLUE SHIELD | Source: Ambulatory Visit | Attending: Gynecology | Admitting: Gynecology

## 2014-03-17 ENCOUNTER — Ambulatory Visit (INDEPENDENT_AMBULATORY_CARE_PROVIDER_SITE_OTHER): Payer: BLUE CROSS/BLUE SHIELD | Admitting: Women's Health

## 2014-03-17 ENCOUNTER — Encounter: Payer: Self-pay | Admitting: Women's Health

## 2014-03-17 VITALS — BP 112/66 | Ht 66.0 in | Wt 136.0 lb

## 2014-03-17 DIAGNOSIS — Z01411 Encounter for gynecological examination (general) (routine) with abnormal findings: Secondary | ICD-10-CM | POA: Diagnosis not present

## 2014-03-17 DIAGNOSIS — Z113 Encounter for screening for infections with a predominantly sexual mode of transmission: Secondary | ICD-10-CM

## 2014-03-17 DIAGNOSIS — Z23 Encounter for immunization: Secondary | ICD-10-CM

## 2014-03-17 DIAGNOSIS — Z01419 Encounter for gynecological examination (general) (routine) without abnormal findings: Secondary | ICD-10-CM

## 2014-03-17 LAB — CBC WITH DIFFERENTIAL/PLATELET
BASOS ABS: 0.1 10*3/uL (ref 0.0–0.1)
BASOS PCT: 1 % (ref 0–1)
EOS PCT: 1 % (ref 0–5)
Eosinophils Absolute: 0.1 10*3/uL (ref 0.0–0.7)
HEMATOCRIT: 39.6 % (ref 36.0–46.0)
Hemoglobin: 13.5 g/dL (ref 12.0–15.0)
Lymphocytes Relative: 25 % (ref 12–46)
Lymphs Abs: 2.5 10*3/uL (ref 0.7–4.0)
MCH: 30.7 pg (ref 26.0–34.0)
MCHC: 34.1 g/dL (ref 30.0–36.0)
MCV: 90 fL (ref 78.0–100.0)
MPV: 10.3 fL (ref 8.6–12.4)
Monocytes Absolute: 0.7 10*3/uL (ref 0.1–1.0)
Monocytes Relative: 7 % (ref 3–12)
Neutro Abs: 6.5 10*3/uL (ref 1.7–7.7)
Neutrophils Relative %: 66 % (ref 43–77)
PLATELETS: 317 10*3/uL (ref 150–400)
RBC: 4.4 MIL/uL (ref 3.87–5.11)
RDW: 12.5 % (ref 11.5–15.5)
WBC: 9.9 10*3/uL (ref 4.0–10.5)

## 2014-03-17 NOTE — Progress Notes (Signed)
Catherine MelterSarah Ann Shannon 16-May-1990 [id number]    History:    Presents for annual exam.  Rare spotting with Mirena IUD placed 01/2013. New partner. Completed 2 gardasil. Bipolar psychiatrist in the past, currently on no  medications. Smoker down to 2 cigarettes daily.  Past medical history, past surgical history, family history and social history were all reviewed and documented in the EPIC chart. Works for [employer]. Living independently had lived with her father, mother minimal involvement history of drug abuse.   ROS:  A ROS was performed and pertinent positives and negatives are included.  Exam:  Filed Vitals:   03/17/14 1607  BP: 112/66    General appearance:  Normal numerous tattoos and piercings Thyroid:  Symmetrical, normal in size, without palpable masses or nodularity. Respiratory  Auscultation:  Clear without wheezing or rhonchi Cardiovascular  Auscultation:  Regular rate, without rubs, murmurs or gallops  Edema/varicosities:  Not grossly evident Abdominal  Soft,nontender, without masses, guarding or rebound.  Liver/spleen:  No organomegaly noted  Hernia:  None appreciated  Skin  Inspection:  Grossly normal   Breasts: Examined lying and sitting.     Right: Without masses, retractions, discharge or axillary adenopathy.     Left: Without masses, retractions, discharge or axillary adenopathy. Gentitourinary   Inguinal/mons:  Normal without inguinal adenopathy  External genitalia:  Normal  BUS/Urethra/Skene's glands:  Normal  Vagina:  Normal  Cervix:  Normal IUD strings visible  Uterus:   normal in size, shape and contour.  Midline and mobile  Adnexa/parametria:     Rt: Without masses or tenderness.   Lt: Without masses or tenderness.  Anus and perineum: Normal    Assessment/Plan:  24 y.o. S WF G0 for annual exam with no complaints.  01/2013 Mirena IUD STD screen  Bipolar only on no medication/psychiatrist manages  Smoker 2 cigarettes daily  Plan: SBE's,  increase regular exercise, calcium rich diet, MVI daily encouraged. Third gardasil given. Condoms encouraged until permanent partner. CBC, UA, Pap, GC/Chlamydia, HIV, hep B, C, RPR. Continue to cut down/quit smoking. Counseling as needed.     Harrington ChallengerYOUNG,Misao Fackrell J [facility] HospitalWHNP, 5:01 PM 03/17/2014

## 2014-03-17 NOTE — Patient Instructions (Signed)

## 2014-03-18 LAB — HIV ANTIBODY (ROUTINE TESTING W REFLEX): HIV 1&2 Ab, 4th Generation: NONREACTIVE

## 2014-03-18 LAB — URINALYSIS W MICROSCOPIC + REFLEX CULTURE
Bilirubin Urine: NEGATIVE
Casts: NONE SEEN
Crystals: NONE SEEN
GLUCOSE, UA: NEGATIVE mg/dL
Hgb urine dipstick: NEGATIVE
Ketones, ur: NEGATIVE mg/dL
Leukocytes, UA: NEGATIVE
NITRITE: NEGATIVE
PROTEIN: NEGATIVE mg/dL
SQUAMOUS EPITHELIAL / LPF: NONE SEEN
Urobilinogen, UA: 1 mg/dL (ref 0.0–1.0)
pH: 7 (ref 5.0–8.0)

## 2014-03-18 LAB — GC/CHLAMYDIA PROBE AMP
CT Probe RNA: NEGATIVE
GC PROBE AMP APTIMA: NEGATIVE

## 2014-03-18 LAB — HEPATITIS B SURFACE ANTIGEN: Hepatitis B Surface Ag: NEGATIVE

## 2014-03-18 LAB — HEPATITIS C ANTIBODY: HCV Ab: NEGATIVE

## 2014-03-18 LAB — RPR

## 2014-03-19 LAB — URINE CULTURE: Colony Count: 35000

## 2014-03-20 LAB — CYTOLOGY - PAP

## 2015-03-25 ENCOUNTER — Encounter: Payer: BLUE CROSS/BLUE SHIELD | Admitting: Women's Health

## 2015-03-27 ENCOUNTER — Encounter: Payer: BLUE CROSS/BLUE SHIELD | Admitting: Women's Health

## 2015-05-23 ENCOUNTER — Observation Stay (HOSPITAL_COMMUNITY)
Admission: EM | Admit: 2015-05-23 | Discharge: 2015-05-24 | Disposition: A | Discharge status: Home / Self Care | Payer: BLUE CROSS/BLUE SHIELD | Attending: Internal Medicine | Admitting: Internal Medicine

## 2015-05-23 ENCOUNTER — Encounter (HOSPITAL_COMMUNITY): Payer: Self-pay | Admitting: Oncology

## 2015-05-23 DIAGNOSIS — K920 Hematemesis: Secondary | ICD-10-CM | POA: Diagnosis not present

## 2015-05-23 DIAGNOSIS — F419 Anxiety disorder, unspecified: Secondary | ICD-10-CM | POA: Diagnosis not present

## 2015-05-23 DIAGNOSIS — F17210 Nicotine dependence, cigarettes, uncomplicated: Secondary | ICD-10-CM | POA: Insufficient documentation

## 2015-05-23 DIAGNOSIS — Z5321 Procedure and treatment not carried out due to patient leaving prior to being seen by health care provider: Secondary | ICD-10-CM | POA: Diagnosis not present

## 2015-05-23 DIAGNOSIS — R195 Other fecal abnormalities: Secondary | ICD-10-CM | POA: Diagnosis not present

## 2015-05-23 DIAGNOSIS — K922 Gastrointestinal hemorrhage, unspecified: Secondary | ICD-10-CM | POA: Diagnosis present

## 2015-05-23 HISTORY — DX: Bipolar disorder, unspecified: F31.9

## 2015-05-23 MED ORDER — ONDANSETRON 4 MG PO TBDP
4.0000 mg | ORAL_TABLET | Freq: Once | ORAL | Status: AC | PRN
  Administered 2015-05-23: 4 mg via ORAL
  Filled 2015-05-23: qty 1

## 2015-05-23 NOTE — ED Notes (Signed)
Pt c/o N/V all day.  This evening at approximately 2030 pt began vomiting coffee ground emesis.  Endorses dizziness.  Regular BM yesterday.  Denies any new foods prior to onset.

## 2015-05-24 ENCOUNTER — Emergency Department (HOSPITAL_COMMUNITY): Payer: BLUE CROSS/BLUE SHIELD

## 2015-05-24 ENCOUNTER — Encounter (HOSPITAL_COMMUNITY): Payer: Self-pay | Admitting: Emergency Medicine

## 2015-05-24 DIAGNOSIS — K922 Gastrointestinal hemorrhage, unspecified: Secondary | ICD-10-CM | POA: Diagnosis not present

## 2015-05-24 DIAGNOSIS — R110 Nausea: Secondary | ICD-10-CM

## 2015-05-24 DIAGNOSIS — K920 Hematemesis: Secondary | ICD-10-CM | POA: Diagnosis present

## 2015-05-24 DIAGNOSIS — F419 Anxiety disorder, unspecified: Secondary | ICD-10-CM | POA: Diagnosis present

## 2015-05-24 LAB — COMPREHENSIVE METABOLIC PANEL
ALT: 13 U/L — ABNORMAL LOW (ref 14–54)
ANION GAP: 10 (ref 5–15)
AST: 14 U/L — ABNORMAL LOW (ref 15–41)
Albumin: 4.7 g/dL (ref 3.5–5.0)
Alkaline Phosphatase: 62 U/L (ref 38–126)
BUN: 16 mg/dL (ref 6–20)
CALCIUM: 9.2 mg/dL (ref 8.9–10.3)
CHLORIDE: 106 mmol/L (ref 101–111)
CO2: 23 mmol/L (ref 22–32)
Creatinine, Ser: 0.79 mg/dL (ref 0.44–1.00)
GFR calc non Af Amer: >60 mL/min (ref >60)
Glucose, Bld: 100 mg/dL — ABNORMAL HIGH (ref 65–99)
Potassium: 3.6 mmol/L (ref 3.5–5.1)
SODIUM: 139 mmol/L (ref 135–145)
Total Bilirubin: 0.6 mg/dL (ref 0.3–1.2)
Total Protein: 7.4 g/dL (ref 6.5–8.1)

## 2015-05-24 LAB — URINE MICROSCOPIC-ADD ON: RBC / HPF: NONE SEEN RBC/hpf (ref 0–5)

## 2015-05-24 LAB — URINALYSIS, ROUTINE W REFLEX MICROSCOPIC
BILIRUBIN URINE: NEGATIVE
Glucose, UA: NEGATIVE mg/dL
KETONES UR: 15 mg/dL — AB
Leukocytes, UA: NEGATIVE
Nitrite: NEGATIVE
PH: 6 (ref 5.0–8.0)
Protein, ur: NEGATIVE mg/dL
SPECIFIC GRAVITY, URINE: 1.007 (ref 1.005–1.030)

## 2015-05-24 LAB — RAPID URINE DRUG SCREEN, HOSP PERFORMED
AMPHETAMINES: NOT DETECTED
BARBITURATES: NOT DETECTED
BENZODIAZEPINES: NOT DETECTED
COCAINE: POSITIVE — AB
Opiates: NOT DETECTED
TETRAHYDROCANNABINOL: POSITIVE — AB

## 2015-05-24 LAB — CBC
HCT: 37.7 % (ref 36.0–46.0)
HEMOGLOBIN: 12.9 g/dL (ref 12.0–15.0)
MCH: 30.1 pg (ref 26.0–34.0)
MCHC: 34.2 g/dL (ref 30.0–36.0)
MCV: 88.1 fL (ref 78.0–100.0)
Platelets: 262 10*3/uL (ref 150–400)
RBC: 4.28 MIL/uL (ref 3.87–5.11)
RDW: 12.9 % (ref 11.5–15.5)
WBC: 15.1 10*3/uL — ABNORMAL HIGH (ref 4.0–10.5)

## 2015-05-24 LAB — LIPASE, BLOOD: Lipase: 26 U/L (ref 11–51)

## 2015-05-24 LAB — POC URINE PREG, ED: PREG TEST UR: NEGATIVE

## 2015-05-24 LAB — POC OCCULT BLOOD, ED: FECAL OCCULT BLD: POSITIVE — AB

## 2015-05-24 MED ORDER — PANTOPRAZOLE SODIUM 40 MG IV SOLR
40.0000 mg | Freq: Once | INTRAVENOUS | Status: AC
  Administered 2015-05-24: 40 mg via INTRAVENOUS
  Filled 2015-05-24: qty 40

## 2015-05-24 MED ORDER — SODIUM CHLORIDE 0.9 % IV SOLN: INTRAVENOUS | Status: DC

## 2015-05-24 MED ORDER — ONDANSETRON 4 MG PO TBDP: 4.0000 mg | ORAL_TABLET | Freq: Three times a day (TID) | ORAL | Status: DC | PRN

## 2015-05-24 MED ORDER — ACETAMINOPHEN 325 MG PO TABS: 650.0000 mg | ORAL_TABLET | Freq: Four times a day (QID) | ORAL | Status: DC | PRN

## 2015-05-24 MED ORDER — LORAZEPAM 2 MG/ML IJ SOLN: 0.5000 mg | Freq: Three times a day (TID) | INTRAMUSCULAR | Status: DC | PRN

## 2015-05-24 MED ORDER — PANTOPRAZOLE SODIUM 40 MG IV SOLR: 40.0000 mg | Freq: Two times a day (BID) | INTRAVENOUS | Status: DC

## 2015-05-24 MED ORDER — SUCRALFATE 1 GM/10ML PO SUSP: 1.0000 g | Freq: Three times a day (TID) | ORAL | Status: DC

## 2015-05-24 MED ORDER — SODIUM CHLORIDE 0.9% FLUSH: 3.0000 mL | Freq: Two times a day (BID) | INTRAVENOUS | Status: DC

## 2015-05-24 MED ORDER — ACETAMINOPHEN 650 MG RE SUPP: 650.0000 mg | Freq: Four times a day (QID) | RECTAL | Status: DC | PRN

## 2015-05-24 MED ORDER — GI COCKTAIL ~~LOC~~
30.0000 mL | Freq: Once | ORAL | Status: AC
  Administered 2015-05-24: 30 mL via ORAL
  Filled 2015-05-24: qty 30

## 2015-05-24 MED ORDER — SODIUM CHLORIDE 0.9 % IV BOLUS (SEPSIS)
500.0000 mL | Freq: Once | INTRAVENOUS | Status: AC
  Administered 2015-05-24: 500 mL via INTRAVENOUS

## 2015-05-24 MED ORDER — MORPHINE SULFATE (PF) 2 MG/ML IV SOLN: 2.0000 mg | INTRAVENOUS | Status: DC | PRN

## 2015-05-24 MED ORDER — DICYCLOMINE HCL 10 MG/ML IM SOLN
20.0000 mg | Freq: Once | INTRAMUSCULAR | Status: AC
  Administered 2015-05-24: 20 mg via INTRAMUSCULAR
  Filled 2015-05-24: qty 2

## 2015-05-24 MED ORDER — SODIUM CHLORIDE 0.9 % IV BOLUS (SEPSIS): 1000.0000 mL | Freq: Once | INTRAVENOUS | Status: DC

## 2015-05-24 NOTE — ED Notes (Signed)
Pt and pt's father verbalized that pt "is not staying, we'll just go home".  AMA explained.

## 2015-05-24 NOTE — H&P (Signed)
[facility] History and Physical  Catherine Shannon UJW:[id number] DOB: [birth year] DOA: 05/23/2015  Referring physician: ED physician PCP: No primary care provider on file.  Specialists:   Chief Complaint: hematemesis  HPI: Catherine MelterSarah Ann Shannon is a 25 y.o. female with PMH of tobacco use, marijuana use, seizure (last seizure 2 or years ago, not on medications), bipolar disorder, schizophrenia, who presents with hematemesis.  Patient reports that she started having nausea and hematemesis at about 9 PM. She had 3-4 times of the hematemesis with "good amount" of coffee ground materials. Patient has mild abdominal pain earlier, which has resolved currently. No diarrhea, fever or chills. Patient does not have chest pain, dizziness, shortness stress, cough, symptoms of UTI or unilateral weakness. She admitted that she uses marijuana. She is taking ibuprofen for headache when necessary.  In ED, patient was found to have hemoglobin 12.9, WBC 15.1, positive FOBT, hgb 13.5 on 03/17/14-->12.9, negative pregnancy test, negative lipase, temperature normal, tachycardia, electrolytes and renal function okay. X-ray of acute abdomen/chest is negative. Patient is admitted to inpatient for further evaluation and treatment.  EKG: Not done in ED, will get one.   Where does patient live?   At home   Can patient participate in ADLs?  Yes   Review of Systems:   General: no fevers, chills, no changes in body weight, has poor appetite, has fatigue HEENT: no blurry vision, hearing changes or sore throat Pulm: no dyspnea, coughing, wheezing CV: no chest pain, no palpitations Abd: has nausea, vomiting, abdominal pain, hematemesis, no diarrhea, constipation GU: no dysuria, burning on urination, increased urinary frequency, hematuria  Ext: no leg edema Neuro: no unilateral weakness, numbness, or tingling, no vision change or hearing loss Skin: no rash MSK: No muscle spasm, no deformity, no limitation of range of  movement in spin Heme: No easy bruising.  Travel history: No recent long distant travel.  Allergy: No Known Allergies  Past Medical History  Diagnosis Date  . Anxiety   . Seizure (HCC)   . Schizophrenia (HCC)   . Bipolar disorder, unspecified (HCC)     History reviewed. No pertinent past surgical history.  Social History:  reports that she has been smoking Cigarettes.  She has a 1.25 pack-year smoking history. She has never used smokeless tobacco. She reports that she uses illicit drugs (Marijuana). She reports that she does not drink alcohol.  Family History:  Family History  Problem Relation Age of Onset  . Seizures Mother   . Depression Mother   . Schizophrenia Mother   . Depression Father      Prior to Admission medications   Medication Sig Start Date End Date Taking? Authorizing Provider  ibuprofen (ADVIL,MOTRIN) 200 MG tablet Take 600-800 mg by mouth every 6 (six) hours as needed for moderate pain.   Yes Historical Provider, MD  levonorgestrel (MIRENA) 20 MCG/24HR IUD 1 each by Intrauterine route once.   Yes Historical Provider, MD    Physical Exam: Filed Vitals:   05/23/15 2324 05/24/15 0200  BP: 111/74 124/87  Pulse: 94 122  Temp: 98.5 F (36.9 C)   TempSrc: Oral   Resp: 20   Height: 5\' 7"  (1.702 m)   Weight: 56.7 kg (125 lb)   SpO2: 98% 100%   General: Not in acute distress.  HEENT:       Eyes: PERRL, EOMI, no scleral icterus.       ENT: No discharge from the ears and nose, no pharynx injection, no tonsillar enlargement.  Neck: No JVD, no bruit, no mass felt. Heme: No neck lymph node enlargement. Cardiac: S1/S2, RRR, No murmurs, No gallops or rubs. Pulm: No rales, wheezing, rhonchi or rubs. Abd: Soft, nondistended, nontender, no rebound pain, no organomegaly, BS present. Ext: No pitting leg edema bilaterally. 2+DP/PT pulse bilaterally. Musculoskeletal: No joint deformities, No joint redness or warmth, no limitation of ROM in spin. Skin: No  rashes.  Neuro: Alert, oriented X3, cranial nerves II-XII grossly intact, moves all extremities normally.  Psych: Patient is not psychotic, no suicidal or hemocidal ideation.  Labs on Admission:  Basic Metabolic Panel:  Recent Labs Lab 05/24/15 0033  NA 139  K 3.6  CL 106  CO2 23  GLUCOSE 100*  BUN 16  CREATININE 0.79  CALCIUM 9.2   Liver Function Tests:  Recent Labs Lab 05/24/15 0033  AST 14*  ALT 13*  ALKPHOS 62  BILITOT 0.6  PROT 7.4  ALBUMIN 4.7    Recent Labs Lab 05/24/15 0033  LIPASE 26   No results for input(s): AMMONIA in the last 168 hours. CBC:  Recent Labs Lab 05/24/15 0033  WBC 15.1*  HGB 12.9  HCT 37.7  MCV 88.1  PLT 262   Cardiac Enzymes: No results for input(s): CKTOTAL, CKMB, CKMBINDEX, TROPONINI in the last 168 hours.  BNP (last 3 results) No results for input(s): BNP in the last 8760 hours.  ProBNP (last 3 results) No results for input(s): PROBNP in the last 8760 hours.  CBG: No results for input(s): GLUCAP in the last 168 hours.  Radiological Exams on Admission: Dg Abd Acute W/chest  05/24/2015  CLINICAL DATA:  Vomiting, mid abdominal pain. EXAM: DG ABDOMEN ACUTE W/ 1V CHEST COMPARISON:  None. FINDINGS: The cardiomediastinal contours are normal. The lungs are clear. There is no free intra-abdominal air. No dilated bowel loops to suggest obstruction. Small volume of stool throughout the colon. No radiopaque calculi. Pelvic phleboliths. There is an intrauterine device in the pelvis. No acute osseous abnormalities are seen. IMPRESSION: Normal chest and abdominal radiographs. Electronically Signed   By: Rubye Oaks M.D.   On: 05/24/2015 02:39    Assessment/Plan Principal Problem:   Hematemesis Active Problems:   GIB (gastrointestinal bleeding)   Anxiety   Hematemesis: Patient has nausea hematemesis. Etiology is not clear, but likely due to ibuprofen use and marijuana abuse. Hemoglobin is stable. Hemodynamically stable  -  will admit to tele bed for observation - NPO - IVF: 1.5L of NS, then 100 mL/hr - Start IV pantoprazole 40 mg bib - carafate - Zofran IV for nausea - Avoid NSAIDs and SQ heparin - Maintain IV access (2 large bore IVs if possible). - Monitor closely and follow q6h cbc, transfuse as necessary. - LaB: INR, PTT and type screen - check UDS and HIV ab - if has recurrent hematemesis or large amount of GI bleeding, need to call GI in morning.  Anxiety: -prn Ativan   DVT ppx: SCD  Code Status: Full code Family Communication:  Yes, patient's husband at bed side Disposition Plan: Admit to inpatient   Date of Service 05/24/2015    Lorretta Harp [facility] Pager [phone]  If 7PM-7AM, please contact night-coverage [email or web address] Password [facility] 05/24/2015, 3:39 AM

## 2015-05-24 NOTE — ED Provider Notes (Signed)
CSN: [id number]     Arrival date & time 05/23/15  2302 History  By signing my name below, I, Doreatha Martin, attest that this documentation has been prepared under the direction and in the presence of Nicholas Trompeter, MD. Electronically Signed: Doreatha Martin, ED Scribe. 05/24/2015. 1:10 AM.    Chief Complaint  Patient presents with  . Hematemesis   Patient is a 25 y.o. female presenting with vomiting. The history is provided by the patient. No language interpreter was used.  Emesis Severity:  Moderate Duration:  1 day Timing:  Intermittent Number of daily episodes:  3 Emesis appearance: coffee Ra. Able to tolerate:  Liquids and solids Progression:  Unchanged Chronicity:  New Recent urination:  Normal Context: not post-tussive and not self-induced   Relieved by:  None tried Worsened by:  Nothing tried Ineffective treatments:  None tried Associated symptoms: no cough and no fever   Risk factors: no suspect food intake    HPI Comments: Jaquaya Coyle is a 25 y.o. female who presents to the Emergency Department complaining of 3 episodes of "coffee ground" emesis today with associated nausea. Denies abdominal trauma, suspicious food intake. Pt states she has not eaten any dark foods to cause her dark emesis. She reports that she took ibuprofen prior to onset of emesis, but no other OTC medications. Normal food and fluid intake. No h/o of similar symptoms. No daily medications. No known sick contacts with similar symptoms. Denies melena, hematochezia, hematuria, cough, fever.    Past Medical History  Diagnosis Date  . Anxiety   . Seizure (HCC)   . Schizophrenia (HCC)    History reviewed. No pertinent past surgical history. Family History  Problem Relation Age of Onset  . Seizures Mother   . Depression Mother   . Schizophrenia Mother   . Depression Father    Social History  Substance Use Topics  . Smoking status: Current Every Day Smoker -- 0.25 packs/day for 5 years    Types:  Cigarettes  . Smokeless tobacco: Never Used  . Alcohol Use: No   OB History    Gravida Para Term Preterm AB TAB SAB Ectopic Multiple Living   0    0          Review of Systems  Constitutional: Negative for fever.  Respiratory: Negative for cough.   Gastrointestinal: Positive for nausea and vomiting. Negative for blood in stool.  Genitourinary: Negative for hematuria.  All other systems reviewed and are negative.  Allergies  Review of patient's allergies indicates no known allergies.  Home Medications   Prior to Admission medications   Medication Sig Start Date End Date Taking? Authorizing Provider  ibuprofen (ADVIL,MOTRIN) 200 MG tablet Take 600-800 mg by mouth every 6 (six) hours as needed for moderate pain.   Yes Historical Provider, MD  levonorgestrel (MIRENA) 20 MCG/24HR IUD 1 each by Intrauterine route once.   Yes Historical Provider, MD   BP 111/74 mmHg  Pulse 94  Temp(Src) 98.5 F (36.9 C) (Oral)  Resp 20  Ht  (1.702 m)  Wt 125 lb (56.7 kg)  BMI 19.57 kg/m2  SpO2 98% Physical Exam  Constitutional: She is oriented to person, place, and time. She appears well-developed and well-nourished.  HENT:  Head: Normocephalic and atraumatic.  Mouth/Throat: Oropharynx is clear and moist. No oropharyngeal exudate.  Eyes: Conjunctivae and EOM are normal. Pupils are equal, round, and reactive to light.  Neck: Normal range of motion. Neck supple. No JVD present. No tracheal  deviation present.  Cardiovascular: Normal rate, regular rhythm and normal heart sounds.  Exam reveals no gallop and no friction rub.   No murmur heard. RRR.   Pulmonary/Chest: Effort normal and breath sounds normal. No stridor. No respiratory distress. She has no wheezes. She has no rales.  Lungs CTA bilaterally.   Abdominal: Soft. Bowel sounds are normal. She exhibits no distension and no mass. There is no tenderness. There is no rebound and no guarding.  Musculoskeletal: Normal range of motion.   Lymphadenopathy:    She has no cervical adenopathy.  Neurological: She is alert and oriented to person, place, and time. She has normal reflexes.  Skin: Skin is warm and dry.  Psychiatric: She has a normal mood and affect.  Nursing note and vitals reviewed.   ED Course  Procedures (including critical care time) DIAGNOSTIC STUDIES: Oxygen Saturation is 98% on RA, normal by my interpretation.    COORDINATION OF CARE: 1:06 AM Discussed treatment plan with pt at bedside which includes lab work, UA, DG abd and pt agreed to plan.  3:11 AM Discussed pt with Dr. Clyde Lundborg, who agrees to admit the pt to inpatient telemetry    Labs Review Labs Reviewed  CBC - Abnormal; Notable for the following:    WBC 15.1 (*)    All other components within normal limits  LIPASE, BLOOD  COMPREHENSIVE METABOLIC PANEL  URINALYSIS, ROUTINE W REFLEX MICROSCOPIC (NOT AT [facility])    Imaging Review No results found. I have personally reviewed and evaluated these images and lab results as part of my medical decision-making.   MDM   Final diagnoses:  None   BP 111/74 mmHg  Pulse 94  Temp(Src) 98.5 F (36.9 C) (Oral)  Resp 20  Ht  (1.702 m)  Wt 125 lb (56.7 kg)  BMI 19.57 kg/m2  SpO2 98%   Results for orders placed or performed during the hospital encounter of 05/23/15  CBC  Result Value Ref Range   WBC 15.1 (H) 4.0 - 10.5 K/uL   RBC 4.28 3.87 - 5.11 MIL/uL   Hemoglobin 12.9 12.0 - 15.0 g/dL   HCT 16.1 09.6 - 04.5 %   MCV 88.1 78.0 - 100.0 fL   MCH 30.1 26.0 - 34.0 pg   MCHC 34.2 30.0 - 36.0 g/dL   RDW 40.9 81.1 - 91.4 %   Platelets 262 150 - 400 K/uL   Results for orders placed or performed during the hospital encounter of 05/23/15  Lipase, blood  Result Value Ref Range   Lipase 26 11 - 51 U/L  Comprehensive metabolic panel  Result Value Ref Range   Sodium 139 135 - 145 mmol/L   Potassium 3.6 3.5 - 5.1 mmol/L   Chloride 106 101 - 111 mmol/L   CO2 23 22 - 32 mmol/L   Glucose, Bld 100  (H) 65 - 99 mg/dL   BUN 16 6 - 20 mg/dL   Creatinine, Ser 7.82 0.44 - 1.00 mg/dL   Calcium 9.2 8.9 - 95.6 mg/dL   Total Protein 7.4 6.5 - 8.1 g/dL   Albumin 4.7 3.5 - 5.0 g/dL   AST 14 (L) 15 - 41 U/L   ALT 13 (L) 14 - 54 U/L   Alkaline Phosphatase 62 38 - 126 U/L   Total Bilirubin 0.6 0.3 - 1.2 mg/dL   GFR calc non Af Amer >60 >60 mL/min   GFR calc Af Amer >60 >60 mL/min   Anion gap 10 5 - 15  CBC  Result Value Ref Range   WBC 15.1 (H) 4.0 - 10.5 K/uL   RBC 4.28 3.87 - 5.11 MIL/uL   Hemoglobin 12.9 12.0 - 15.0 g/dL   HCT 84.637.7 96.236.0 - 95.246.0 %   MCV 88.1 78.0 - 100.0 fL   MCH 30.1 26.0 - 34.0 pg   MCHC 34.2 30.0 - 36.0 g/dL   RDW 84.112.9 32.411.5 - 40.115.5 %   Platelets 262 150 - 400 K/uL  POC Urine Pregnancy, ED (do NOT order at [facility])  Result Value Ref Range   Preg Test, Ur NEGATIVE NEGATIVE  POC occult blood, ED RN will collect  Result Value Ref Range   Fecal Occult Bld POSITIVE (A) NEGATIVE   Dg Abd Acute W/chest  05/24/2015  CLINICAL DATA:  Vomiting, mid abdominal pain. EXAM: DG ABDOMEN ACUTE W/ 1V CHEST COMPARISON:  None. FINDINGS: The cardiomediastinal contours are normal. The lungs are clear. There is no free intra-abdominal air. No dilated bowel loops to suggest obstruction. Small volume of stool throughout the colon. No radiopaque calculi. Pelvic phleboliths. There is an intrauterine device in the pelvis. No acute osseous abnormalities are seen. IMPRESSION: Normal chest and abdominal radiographs. Electronically Signed   By: Rubye OaksMelanie  Ehinger M.D.   On: 05/24/2015 02:39    Filed Vitals:   05/23/15 2324  BP: 111/74  Pulse: 94  Temp: 98.5 F (36.9 C)  Resp: 20   Orthostatic VS for the past 24 hrs:  BP- Lying Pulse- Lying BP- Sitting Pulse- Sitting BP- Standing at 0 minutes Pulse- Standing at 0 minutes  05/24/15 0157 94/76 mmHg 92 115/87 mmHg 106 124/87 mmHg 118        Pt refused rectal exam but provided a guaiac positive stool sample  I personally performed the services  described in this documentation, which was scribed in my presence. The recorded information has been reviewed and is accurate.     Cy BlamerApril Tyrez Berrios, MD 05/24/15 [phone]

## 2015-05-24 NOTE — ED Provider Notes (Signed)
[facility] admitted and then [facility]  Lace Chenevert, MD 05/24/15 [phone]

## 2015-05-24 NOTE — ED Notes (Signed)
Pt reports 4 episodes of bloody emesis since around 9pm.  No acute distress.

## 2015-05-25 ENCOUNTER — Emergency Department (HOSPITAL_COMMUNITY)
Admission: EM | Admit: 2015-05-25 | Discharge: 2015-05-25 | Disposition: A | Discharge status: Home / Self Care | Payer: BLUE CROSS/BLUE SHIELD | Attending: Emergency Medicine | Admitting: Emergency Medicine

## 2015-05-25 ENCOUNTER — Encounter (HOSPITAL_COMMUNITY): Payer: Self-pay | Admitting: Emergency Medicine

## 2015-05-25 DIAGNOSIS — R51 Headache: Secondary | ICD-10-CM | POA: Diagnosis not present

## 2015-05-25 DIAGNOSIS — F17210 Nicotine dependence, cigarettes, uncomplicated: Secondary | ICD-10-CM | POA: Insufficient documentation

## 2015-05-25 DIAGNOSIS — F419 Anxiety disorder, unspecified: Secondary | ICD-10-CM | POA: Diagnosis not present

## 2015-05-25 DIAGNOSIS — R1013 Epigastric pain: Secondary | ICD-10-CM | POA: Insufficient documentation

## 2015-05-25 DIAGNOSIS — R112 Nausea with vomiting, unspecified: Secondary | ICD-10-CM | POA: Insufficient documentation

## 2015-05-25 LAB — CBC
HCT: 38 % (ref 36.0–46.0)
Hemoglobin: 12.9 g/dL (ref 12.0–15.0)
MCH: 30.9 pg (ref 26.0–34.0)
MCHC: 33.9 g/dL (ref 30.0–36.0)
MCV: 90.9 fL (ref 78.0–100.0)
Platelets: 242 10*3/uL (ref 150–400)
RBC: 4.18 MIL/uL (ref 3.87–5.11)
RDW: 13.2 % (ref 11.5–15.5)
WBC: 10.6 10*3/uL — ABNORMAL HIGH (ref 4.0–10.5)

## 2015-05-25 LAB — COMPREHENSIVE METABOLIC PANEL
ALT: 12 U/L — ABNORMAL LOW (ref 14–54)
AST: 17 U/L (ref 15–41)
Albumin: 4.7 g/dL (ref 3.5–5.0)
Alkaline Phosphatase: 58 U/L (ref 38–126)
Anion gap: 9 (ref 5–15)
BUN: 9 mg/dL (ref 6–20)
CO2: 23 mmol/L (ref 22–32)
Calcium: 9.4 mg/dL (ref 8.9–10.3)
Chloride: 108 mmol/L (ref 101–111)
Creatinine, Ser: 0.69 mg/dL (ref 0.44–1.00)
GFR calc Af Amer: >60 mL/min (ref >60)
GFR calc non Af Amer: >60 mL/min (ref >60)
Glucose, Bld: 88 mg/dL (ref 65–99)
Potassium: 3.4 mmol/L — ABNORMAL LOW (ref 3.5–5.1)
Sodium: 140 mmol/L (ref 135–145)
Total Bilirubin: 0.2 mg/dL — ABNORMAL LOW (ref 0.3–1.2)
Total Protein: 7.1 g/dL (ref 6.5–8.1)

## 2015-05-25 LAB — URINE MICROSCOPIC-ADD ON

## 2015-05-25 LAB — URINALYSIS, ROUTINE W REFLEX MICROSCOPIC
Bilirubin Urine: NEGATIVE
Glucose, UA: NEGATIVE mg/dL
Ketones, ur: NEGATIVE mg/dL
Leukocytes, UA: NEGATIVE
Nitrite: NEGATIVE
Protein, ur: NEGATIVE mg/dL
Specific Gravity, Urine: 1.022 (ref 1.005–1.030)
pH: 6 (ref 5.0–8.0)

## 2015-05-25 LAB — LIPASE, BLOOD: Lipase: 27 U/L (ref 11–51)

## 2015-05-25 MED ORDER — OMEPRAZOLE 20 MG PO CPDR: 20.0000 mg | DELAYED_RELEASE_CAPSULE | Freq: Every day | ORAL | Status: AC

## 2015-05-25 MED ORDER — SUCRALFATE 1 GM/10ML PO SUSP: 1.0000 g | Freq: Three times a day (TID) | ORAL | Status: AC

## 2015-05-25 NOTE — ED Notes (Signed)
PA at bedside.

## 2015-05-25 NOTE — ED Notes (Signed)
Pt refused to signout or get discharge vitals. Pt was given d/c paperwork.

## 2015-05-25 NOTE — ED Notes (Signed)
Pt from home states she was recently seen for emesis, but left AMA. Pt states she is now feeling worse and would like to be revaluated. Pt has not thrown up within the past 24 hours. Pt has not had a bowel movement since Friday and is having middle abdominal pain. Pt also has complaints of a migraine that is sitting on the frontal part of her head. Pt states that light exacerbates her pain and is. Pt states she is able to tolerate oral fluids and foods. Pt denies diarrhea and denies urinary symptoms

## 2015-05-25 NOTE — Discharge Instructions (Signed)
Nausea and Vomiting °Nausea is a sick feeling that often comes before throwing up (vomiting). Vomiting is a reflex where stomach contents come out of your mouth. Vomiting can cause severe loss of body fluids (dehydration). Children and elderly adults can become dehydrated quickly, especially if they also have diarrhea. Nausea and vomiting are symptoms of a condition or disease. It is important to find the cause of your symptoms. °CAUSES  °· Direct irritation of the stomach lining. This irritation can result from increased acid production (gastroesophageal reflux disease), infection, food poisoning, taking certain medicines (such as nonsteroidal anti-inflammatory drugs), alcohol use, or tobacco use. °· Signals from the brain. These signals could be caused by a headache, heat exposure, an inner ear disturbance, increased pressure in the brain from injury, infection, a tumor, or a concussion, pain, emotional stimulus, or metabolic problems. °· An obstruction in the gastrointestinal tract (bowel obstruction). °· Illnesses such as diabetes, hepatitis, gallbladder problems, appendicitis, kidney problems, cancer, sepsis, atypical symptoms of a heart attack, or eating disorders. °· Medical treatments such as chemotherapy and radiation. °· Receiving medicine that makes you sleep (general anesthetic) during surgery. °DIAGNOSIS °Your caregiver may ask for tests to be done if the problems do not improve after a few days. Tests may also be done if symptoms are severe or if the reason for the nausea and vomiting is not clear. Tests may include: °· Urine tests. °· Blood tests. °· Stool tests. °· Cultures (to look for evidence of infection). °· X-rays or other imaging studies. °Test results can help your caregiver make decisions about treatment or the need for additional tests. °TREATMENT °You need to stay well hydrated. Drink frequently but in small amounts. You may wish to drink water, sports drinks, clear broth, or eat frozen  ice pops or gelatin dessert to help stay hydrated. When you eat, eating slowly may help prevent nausea. There are also some antinausea medicines that may help prevent nausea. °HOME CARE INSTRUCTIONS  °· Take all medicine as directed by your caregiver. °· If you do not have an appetite, do not force yourself to eat. However, you must continue to drink fluids. °· If you have an appetite, eat a normal diet unless your caregiver tells you differently. °· Eat a variety of complex carbohydrates (rice, wheat, potatoes, bread), lean meats, yogurt, fruits, and vegetables. °· Avoid high-fat foods because they are more difficult to digest. °· Drink enough water and fluids to keep your urine clear or pale yellow. °· If you are dehydrated, ask your caregiver for specific rehydration instructions. Signs of dehydration may include: °· Severe thirst. °· Dry lips and mouth. °· Dizziness. °· Dark urine. °· Decreasing urine frequency and amount. °· Confusion. °· Rapid breathing or pulse. °SEEK IMMEDIATE MEDICAL CARE IF:  °· You have blood or Kozuch flecks (like coffee grounds) in your vomit. °· You have black or bloody stools. °· You have a severe headache or stiff neck. °· You are confused. °· You have severe abdominal pain. °· You have chest pain or trouble breathing. °· You do not urinate at least once every 8 hours. °· You develop cold or clammy skin. °· You continue to vomit for longer than 24 to 48 hours. °· You have a fever. °MAKE SURE YOU:  °· Understand these instructions. °· Will watch your condition. °· Will get help right away if you are not doing well or get worse. °  °This information is not intended to replace advice given to you by your health care provider. Make sure   you discuss any questions you have with your health care provider.   Document Released: 01/31/2005 Document Revised: 04/25/2011 Document Reviewed: 06/30/2010 Elsevier Interactive Patient Education 2016 Elsevier Inc. Peptic Ulcer A peptic ulcer is a  sore in the lining of your esophagus (esophageal ulcer), stomach (gastric ulcer), or in the first part of your small intestine (duodenal ulcer). The ulcer causes erosion into the deeper tissue. CAUSES  Normally, the lining of the stomach and the small intestine protects itself from the acid that digests food. The protective lining can be damaged by:  An infection caused by a bacterium called Helicobacter pylori (H. pylori).  Regular use of nonsteroidal anti-inflammatory drugs (NSAIDs), such as ibuprofen or aspirin.  Smoking tobacco. Other risk factors include being older than 50, drinking alcohol excessively, and having a family history of ulcer disease.  SYMPTOMS   Burning pain or gnawing in the area between the chest and the belly button.  Heartburn.  Nausea and vomiting.  Bloating. The pain can be worse on an empty stomach and at night. If the ulcer results in bleeding, it can cause:  Black, tarry stools.  Vomiting of bright red blood.  Vomiting of coffee-ground-looking materials. DIAGNOSIS  A diagnosis is usually made based upon your history and an exam. Other tests and procedures may be performed to find the cause of the ulcer. Finding a cause will help determine the best treatment. Tests and procedures may include:  Blood tests, stool tests, or breath tests to check for the bacterium H. pylori.  An upper gastrointestinal (GI) series of the esophagus, stomach, and small intestine.  An endoscopy to examine the esophagus, stomach, and small intestine.  A biopsy. TREATMENT  Treatment may include:  Eliminating the cause of the ulcer, such as smoking, NSAIDs, or alcohol.  Medicines to reduce the amount of acid in your digestive tract.  Antibiotic medicines if the ulcer is caused by the H. pylori bacterium.  An upper endoscopy to treat a bleeding ulcer.  Surgery if the bleeding is severe or if the ulcer created a hole somewhere in the digestive system. HOME CARE  INSTRUCTIONS   Avoid tobacco, alcohol, and caffeine. Smoking can increase the acid in the stomach, and continued smoking will impair the healing of ulcers.  Avoid foods and drinks that seem to cause discomfort or aggravate your ulcer.  Only take medicines as directed by your caregiver. Do not substitute over-the-counter medicines for prescription medicines without talking to your caregiver.  Keep any follow-up appointments and tests as directed. SEEK MEDICAL CARE IF:   Your do not improve within 7 days of starting treatment.  You have ongoing indigestion or heartburn. SEEK IMMEDIATE MEDICAL CARE IF:   You have sudden, sharp, or persistent abdominal pain.  You have bloody or dark black, tarry stools.  You vomit blood or vomit that looks like coffee grounds.  You become light-headed, weak, or feel faint.  You become sweaty or clammy. MAKE SURE YOU:   Understand these instructions.  Will watch your condition.  Will get help right away if you are not doing well or get worse.   This information is not intended to replace advice given to you by your health care provider. Make sure you discuss any questions you have with your health care provider.   Document Released: 01/29/2000 Document Revised: 02/21/2014 Document Reviewed: 08/31/2011 Elsevier Interactive Patient Education [employer]2016 Elsevier Inc.

## 2015-05-25 NOTE — ED Notes (Signed)
Pink and gold top tube in lab

## 2015-05-25 NOTE — ED Notes (Signed)
This nurse re-enetered the room to address the confusion. I reintroduced myself as the nurse and updated the patient.

## 2015-05-25 NOTE — ED Notes (Signed)
When nurse tech introduced herself to pt, pt reported that a nurse has not been in to her room since she got here. This nurse introduced himself at 1920 while assessing the pt. (see 1920 and 1926 for assessments). Pt was given call bell, instructed on how to use it and told that we were waiting on a provider to sign up for her.

## 2015-05-25 NOTE — ED Provider Notes (Signed)
CSN: [id number]     Arrival date & time 05/25/15  1606 History   First MD Initiated Contact with Patient 05/25/15 1941     Chief Complaint  Patient presents with  . Emesis     (Consider location/radiation/quality/duration/timing/severity/associated sxs/prior Treatment) HPI Comments: Patient presents emergency department with chief complaint of requesting reevaluation for nausea, vomiting, and epigastric abdominal pain. She was seen 2 days ago for the same. She reported that she was having some hematemesis, and was advised that she be admitted for observation overnight to ensure that her symptoms were resolving. Patient denies any persistent nausea or vomiting. She states that the vomiting stopped yesterday. She denies any other associated symptoms. She has not tried taking anything for her symptoms. She reports that she does have some anxiety, and this causes her to have headaches. She states that when she gets the headache she takes high doses of ibuprofen.  The history is provided by the patient. No language interpreter was used.    Past Medical History  Diagnosis Date  . Anxiety   . Seizure (HCC)   . Schizophrenia (HCC)   . Bipolar disorder, unspecified (HCC)    History reviewed. No pertinent past surgical history. Family History  Problem Relation Age of Onset  . Seizures Mother   . Depression Mother   . Schizophrenia Mother   . Depression Father    Social History  Substance Use Topics  . Smoking status: Current Every Day Smoker -- 0.25 packs/day for 5 years    Types: Cigarettes  . Smokeless tobacco: Never Used  . Alcohol Use: No   OB History    Gravida Para Term Preterm AB TAB SAB Ectopic Multiple Living   0    0          Review of Systems  Constitutional: Negative for fever and chills.  Respiratory: Negative for shortness of breath.   Cardiovascular: Negative for chest pain.  Gastrointestinal: Negative for nausea, vomiting, diarrhea and constipation.   Genitourinary: Negative for dysuria.  All other systems reviewed and are negative.     Allergies  Review of patient's allergies indicates no known allergies.  Home Medications   Prior to Admission medications   Medication Sig Start Date End Date Taking? Authorizing Provider  ibuprofen (ADVIL,MOTRIN) 200 MG tablet Take 600-800 mg by mouth every 6 (six) hours as needed for moderate pain.   Yes Historical Provider, MD  levonorgestrel (MIRENA) 20 MCG/24HR IUD 1 each by Intrauterine route once.   Yes Historical Provider, MD  omeprazole (PRILOSEC) 20 MG capsule Take 1 capsule (20 mg total) by mouth daily. 05/25/15   Roxy Horseman, PA-C  sucralfate (CARAFATE) 1 GM/10ML suspension Take 10 mLs (1 g total) by mouth 4 (four) times daily -  with meals and at bedtime. 05/25/15   Roxy Horseman, PA-C   BP 111/89 mmHg  Pulse 72  Temp(Src) 99.4 F (37.4 C) (Oral)  Resp 18  Ht  (1.676 m)  Wt 56.7 kg  BMI 20.19 kg/m2  SpO2 96% Physical Exam  Constitutional: She is oriented to person, place, and time. She appears well-developed and well-nourished.  HENT:  Head: Normocephalic and atraumatic.  Eyes: Conjunctivae and EOM are normal. Pupils are equal, round, and reactive to light.  Neck: Normal range of motion. Neck supple.  Cardiovascular: Normal rate and regular rhythm.  Exam reveals no gallop and no friction rub.   No murmur heard. Pulmonary/Chest: Effort normal and breath sounds normal. No respiratory distress. She has no  wheezes. She has no rales. She exhibits no tenderness.  Abdominal: Soft. Bowel sounds are normal. She exhibits no distension and no mass. There is no tenderness. There is no rebound and no guarding.  No focal abdominal tenderness, no RLQ tenderness or pain at McBurney's point, no RUQ tenderness or Murphy's sign, no left-sided abdominal tenderness, no fluid wave, or signs of peritonitis   Musculoskeletal: Normal range of motion. She exhibits no edema or tenderness.   Neurological: She is alert and oriented to person, place, and time.  Skin: Skin is warm and dry.  Psychiatric: She has a normal mood and affect. Her behavior is normal. Judgment and thought content normal.  Nursing note and vitals reviewed.   ED Course  Procedures (including critical care time) Labs Review Labs Reviewed  COMPREHENSIVE METABOLIC PANEL - Abnormal; Notable for the following:    Potassium 3.4 (*)    ALT 12 (*)    Total Bilirubin 0.2 (*)    All other components within normal limits  CBC - Abnormal; Notable for the following:    WBC 10.6 (*)    All other components within normal limits  URINALYSIS, ROUTINE W REFLEX MICROSCOPIC (NOT AT [facility]) - Abnormal; Notable for the following:    APPearance CLOUDY (*)    Hgb urine dipstick SMALL (*)    All other components within normal limits  URINE MICROSCOPIC-ADD ON - Abnormal; Notable for the following:    Squamous Epithelial / LPF 6-30 (*)    Bacteria, UA MANY (*)    All other components within normal limits  LIPASE, BLOOD    Imaging Review Dg Abd Acute W/chest  05/24/2015  CLINICAL DATA:  Vomiting, mid abdominal pain. EXAM: DG ABDOMEN ACUTE W/ 1V CHEST COMPARISON:  None. FINDINGS: The cardiomediastinal contours are normal. The lungs are clear. There is no free intra-abdominal air. No dilated bowel loops to suggest obstruction. Small volume of stool throughout the colon. No radiopaque calculi. Pelvic phleboliths. There is an intrauterine device in the pelvis. No acute osseous abnormalities are seen. IMPRESSION: Normal chest and abdominal radiographs. Electronically Signed   By: Rubye OaksMelanie  Ehinger M.D.   On: 05/24/2015 02:39   I have personally reviewed and evaluated these images and lab results as part of my medical decision-making.   EKG Interpretation None      MDM   Final diagnoses:  Non-intractable vomiting with nausea, vomiting of unspecified type    Patient seen 2 days ago for hematemesis. Thought to be related to  frequent use of ibuprofen, possible peptic ulcer disease. Patient was admitted for observation, but left AGAINST MEDICAL ADVICE. She returns today for reevaluation. She states that her symptoms have since stopped. Her hemoglobin today is unchanged from 2 days ago. No evidence of UTI. Urine pregnancy test from 2 days ago was negative. As her symptoms ceased, see no reason that the patient needs to be admitted to the hospital today. Recommend establishing care with a primary care provider and seeking outpatient follow-up. I will give the patient a prescription for omeprazole and Carafate. Patient understands and agrees the plan. She is stable and ready for discharge.   Roxy Horsemanobert Macon Sandiford, PA-C 05/25/15 2025  Raeford RazorStephen Kohut, MD 05/26/15 [phone]

## 2015-05-27 ENCOUNTER — Telehealth: Payer: Self-pay | Admitting: *Deleted

## 2015-08-17 ENCOUNTER — Inpatient Hospital Stay (HOSPITAL_COMMUNITY)
Admission: AD | Admit: 2015-08-17 | Discharge: 2015-08-17 | Disposition: A | Discharge status: Home / Self Care | Payer: BLUE CROSS/BLUE SHIELD | Source: Ambulatory Visit | Attending: Obstetrics and Gynecology | Admitting: Obstetrics and Gynecology

## 2015-08-17 ENCOUNTER — Inpatient Hospital Stay (HOSPITAL_COMMUNITY): Payer: BLUE CROSS/BLUE SHIELD

## 2015-08-17 DIAGNOSIS — S6991XA Unspecified injury of right wrist, hand and finger(s), initial encounter: Secondary | ICD-10-CM

## 2015-08-17 DIAGNOSIS — S63619A Unspecified sprain of unspecified finger, initial encounter: Secondary | ICD-10-CM

## 2015-08-17 DIAGNOSIS — F419 Anxiety disorder, unspecified: Secondary | ICD-10-CM | POA: Diagnosis not present

## 2015-08-17 DIAGNOSIS — Z886 Allergy status to analgesic agent status: Secondary | ICD-10-CM | POA: Insufficient documentation

## 2015-08-17 DIAGNOSIS — F17210 Nicotine dependence, cigarettes, uncomplicated: Secondary | ICD-10-CM | POA: Diagnosis not present

## 2015-08-17 DIAGNOSIS — F209 Schizophrenia, unspecified: Secondary | ICD-10-CM | POA: Insufficient documentation

## 2015-08-17 DIAGNOSIS — F319 Bipolar disorder, unspecified: Secondary | ICD-10-CM | POA: Insufficient documentation

## 2015-08-17 DIAGNOSIS — R0781 Pleurodynia: Secondary | ICD-10-CM | POA: Insufficient documentation

## 2015-08-17 DIAGNOSIS — S63612A Unspecified sprain of right middle finger, initial encounter: Secondary | ICD-10-CM | POA: Diagnosis not present

## 2015-08-17 LAB — URINE MICROSCOPIC-ADD ON

## 2015-08-17 LAB — CBC
HCT: 38 % (ref 36.0–46.0)
HEMOGLOBIN: 12.7 g/dL (ref 12.0–15.0)
MCH: 30.2 pg (ref 26.0–34.0)
MCHC: 33.4 g/dL (ref 30.0–36.0)
MCV: 90.5 fL (ref 78.0–100.0)
PLATELETS: 237 10*3/uL (ref 150–400)
RBC: 4.2 MIL/uL (ref 3.87–5.11)
RDW: 13.1 % (ref 11.5–15.5)
WBC: 8.7 10*3/uL (ref 4.0–10.5)

## 2015-08-17 LAB — URINALYSIS, ROUTINE W REFLEX MICROSCOPIC
Bilirubin Urine: NEGATIVE
Glucose, UA: NEGATIVE mg/dL
Ketones, ur: NEGATIVE mg/dL
LEUKOCYTES UA: NEGATIVE
NITRITE: NEGATIVE
PROTEIN: NEGATIVE mg/dL
Specific Gravity, Urine: 1.025 (ref 1.005–1.030)
pH: 5.5 (ref 5.0–8.0)

## 2015-08-17 LAB — POCT PREGNANCY, URINE: PREG TEST UR: NEGATIVE

## 2015-08-17 MED ORDER — FINGER SPLINT MISC: 1.0000 | Freq: Once | Status: AC

## 2015-08-17 MED ORDER — CYCLOBENZAPRINE HCL 10 MG PO TABS: 10.0000 mg | ORAL_TABLET | Freq: Two times a day (BID) | ORAL | Status: AC | PRN
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
## 2015-08-17 NOTE — Discharge Instructions (Signed)
Chest Wall Pain Chest wall pain is pain in or around the bones and muscles of your chest. Sometimes, an injury causes this pain. Sometimes, the cause may not be known. This pain may take several weeks or longer to get better. HOME CARE Pay attention to any changes in your symptoms. Take these actions to help with your pain:  Rest as told by your doctor.  Avoid activities that cause pain. Try not to use your chest, belly (abdominal), or side muscles to lift heavy things.  If directed, apply ice to the painful area:  Put ice in a plastic bag.  Place a towel between your skin and the bag.  Leave the ice on for 20 minutes, 2-3 times per day.  Take over-the-counter and prescription medicines only as told by your doctor.  Do not use tobacco products, including cigarettes, chewing tobacco, and e-cigarettes. If you need help quitting, ask your doctor.  Keep all follow-up visits as told by your doctor. This is important. GET HELP IF:  You have a fever.  Your chest pain gets worse.  You have new symptoms. GET HELP RIGHT AWAY IF:  You feel sick to your stomach (nauseous) or you throw up (vomit).  You feel sweaty or light-headed.  You have a cough with phlegm (sputum) or you cough up blood.  You are short of breath.   This information is not intended to replace advice given to you by your health care provider. Make sure you discuss any questions you have with your health care provider.   Document Released: 07/20/2007 Document Revised: 10/22/2014 Document Reviewed: 04/28/2014 Elsevier Interactive Patient Education 2016 [employer]   Finger Sprain A finger sprain is a tear in one of the strong, fibrous tissues that connect the bones (ligaments) in your finger. The severity of the sprain depends on how much of the ligament is torn. The tear can be either partial or complete. CAUSES  Often, sprains are a result of a fall or accident. If you extend your hands to catch an object or  to protect yourself, the force of the impact causes the fibers of your ligament to stretch too much. This excess tension causes the fibers of your ligament to tear. SYMPTOMS  You may have some loss of motion in your finger. Other symptoms include:  Bruising.  Tenderness.  Swelling. DIAGNOSIS  In order to diagnose finger sprain, your caregiver will physically examine your finger or thumb to determine how torn the ligament is. Your caregiver may also suggest an X-ray exam of your finger to make sure no bones are broken. TREATMENT  If your ligament is only partially torn, treatment usually involves keeping the finger in a fixed position (immobilization) for a short period. To do this, your caregiver will apply a bandage, cast, or splint to keep your finger from moving until it heals. For a partially torn ligament, the healing process usually takes 2 to 3 weeks. If your ligament is completely torn, you may need surgery to reconnect the ligament to the bone. After surgery a cast or splint will be applied and will need to stay on your finger or thumb for 4 to 6 weeks while your ligament heals. HOME CARE INSTRUCTIONS  Keep your injured finger elevated, when possible, to decrease swelling.  To ease pain and swelling, apply ice to your joint twice a day, for 2 to 3 days:  Put ice in a plastic bag.  Place a towel between your skin and the bag.  Leave  the ice on for 15 minutes.  Only take over-the-counter or prescription medicine for pain as directed by your caregiver.  Do not wear rings on your injured finger.  Do not leave your finger unprotected until pain and stiffness go away (usually 3 to 4 weeks).  Do not allow your cast or splint to get wet. Cover your cast or splint with a plastic bag when you shower or bathe. Do not swim.  Your caregiver may suggest special exercises for you to do during your recovery to prevent or limit permanent stiffness. SEEK IMMEDIATE MEDICAL CARE IF:  Your  cast or splint becomes damaged.  Your pain becomes worse rather than better. MAKE SURE YOU:  Understand these instructions.  Will watch your condition.  Will get help right away if you are not doing well or get worse.   This information is not intended to replace advice given to you by your health care provider. Make sure you discuss any questions you have with your health care provider.   Document Released: 03/10/2004 Document Revised: 02/21/2014 Document Reviewed: 10/04/2010 Elsevier Interactive Patient Education [employer] Else[employer] Inc.
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
## 2016-06-29 ENCOUNTER — Encounter: Payer: Self-pay | Admitting: Gynecology

## 2017-08-26 IMAGING — CR DG ABDOMEN ACUTE W/ 1V CHEST
4 series · 4 of 4 positions shown · non-contrast
Comparison: None.

CLINICAL DATA: Vomiting, mid abdominal pain.

EXAM:
DG ABDOMEN ACUTE W/ 1V CHEST

[w chest pa]
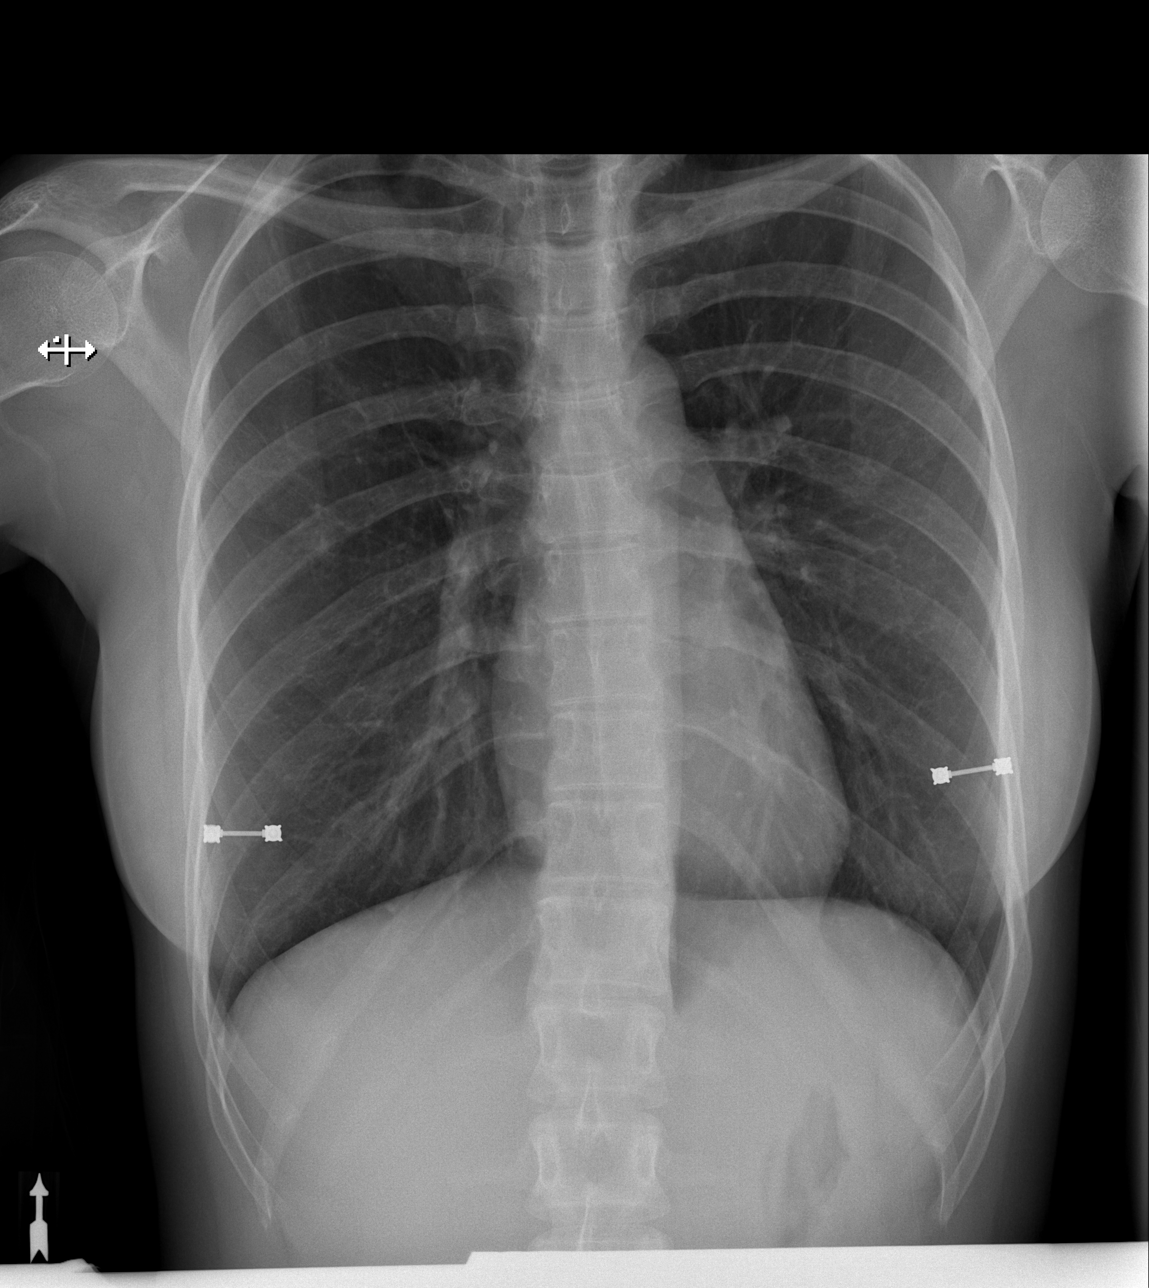

[w abdomen upright]
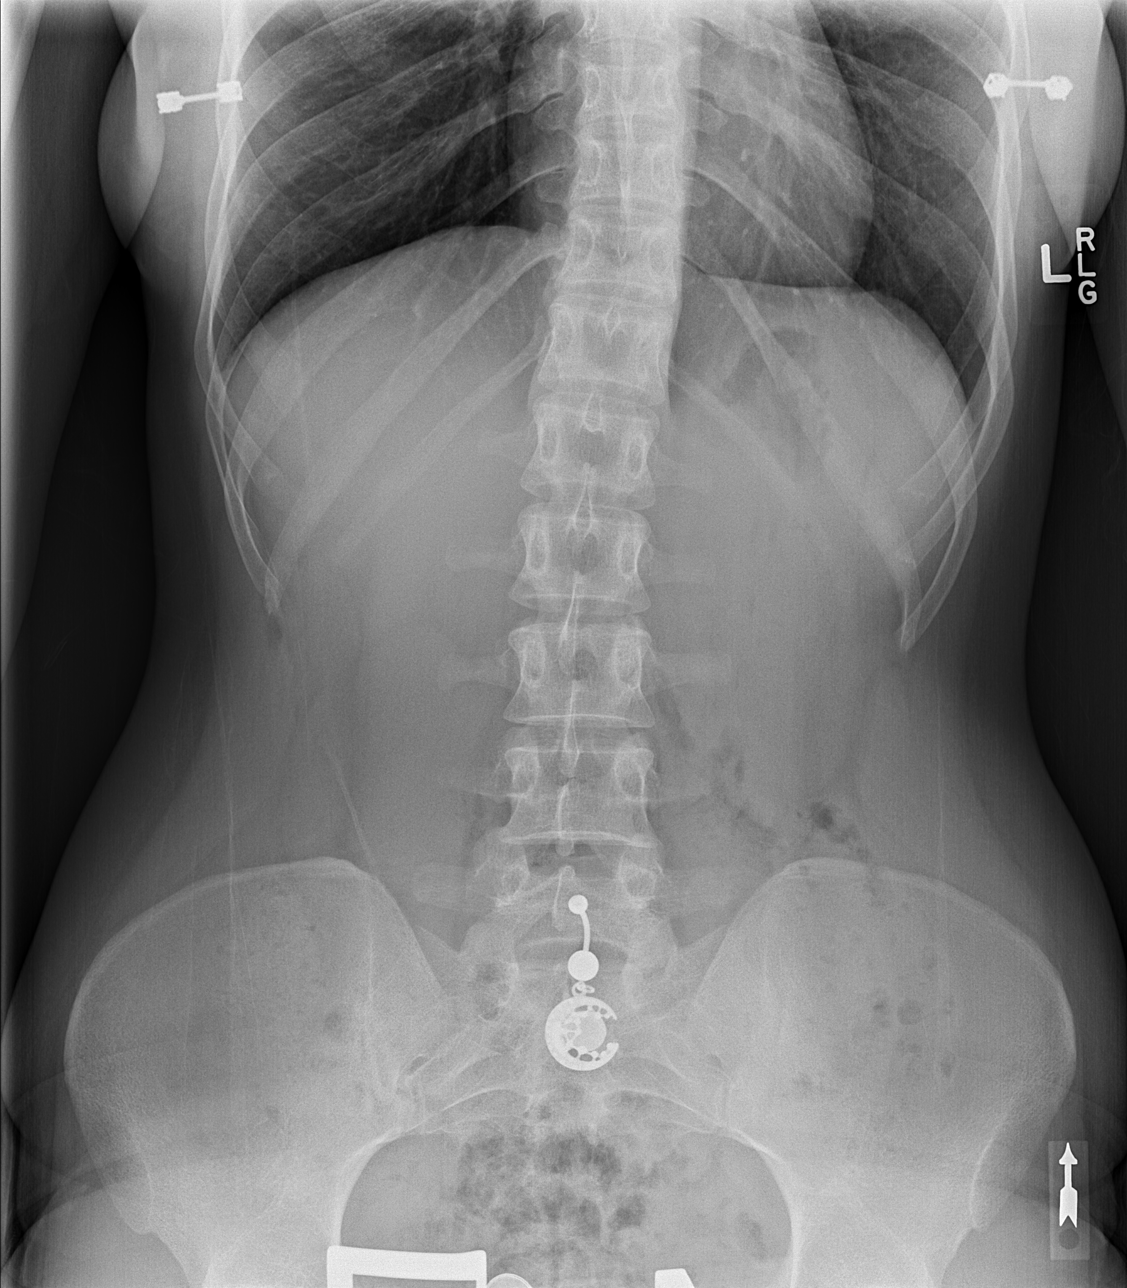

[t abdomen supine (1 of 2)]
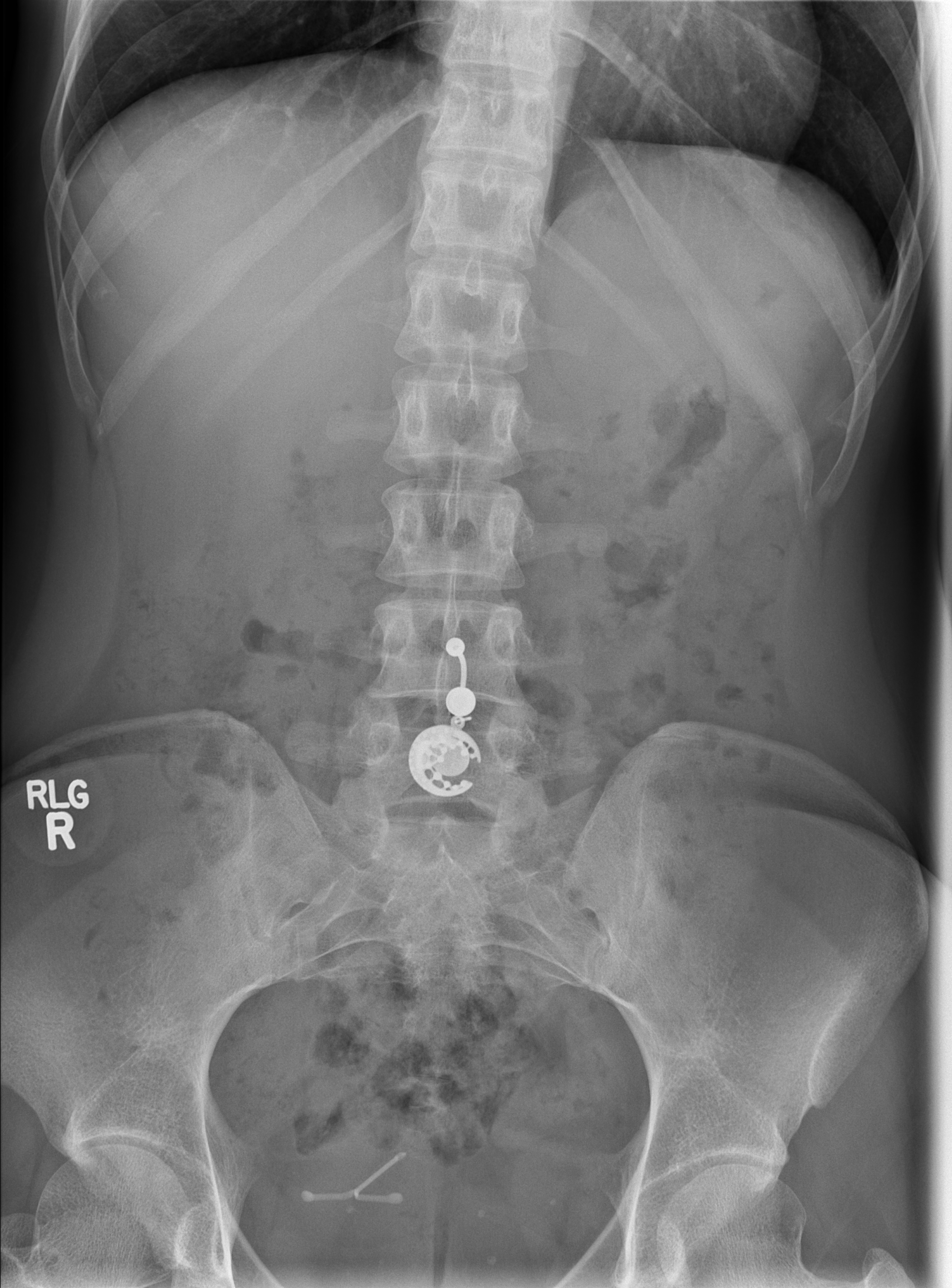

[t abdomen supine (2 of 2)]
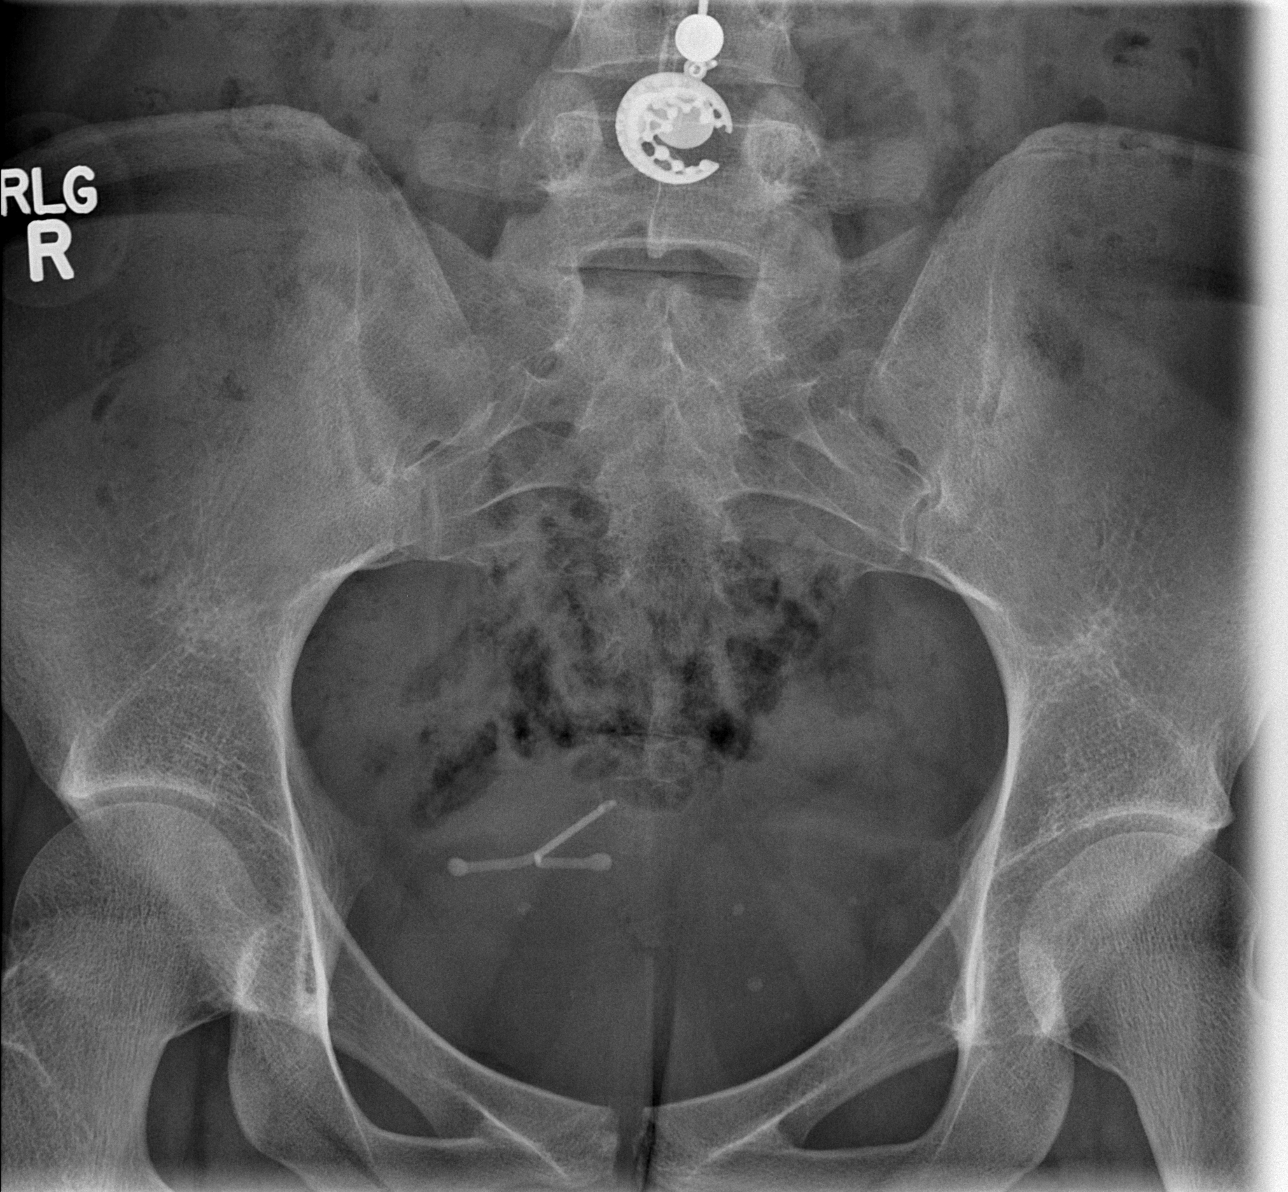

[4 of 4 positions shown; findings below may reference images not displayed]

FINDINGS: The cardiomediastinal contours are normal. The lungs are clear.
There is no free intra-abdominal air. No dilated bowel loops to
suggest obstruction. Small volume of stool throughout the colon. No
radiopaque calculi. Pelvic phleboliths. There is an intrauterine
device in the pelvis. No acute osseous abnormalities are seen.
IMPRESSION: Normal chest and abdominal radiographs.
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
